# Patient Record
Sex: Male | Born: 1970 | Hispanic: Yes | Marital: Married | Smoking: Former smoker
Health system: Southern US, Community
[De-identification: ages and names within clinical notes are randomized; demographics above are authoritative.]

## PROBLEM LIST (undated history)

## (undated) DIAGNOSIS — T7840XA Allergy, unspecified, initial encounter: Secondary | ICD-10-CM

## (undated) HISTORY — DX: Allergy, unspecified, initial encounter: T78.40XA

---

## 2019-06-20 DIAGNOSIS — Z20828 Contact with and (suspected) exposure to other viral communicable diseases: Secondary | ICD-10-CM | POA: Diagnosis not present

## 2020-05-10 ENCOUNTER — Other Ambulatory Visit: Payer: Self-pay

## 2020-05-11 ENCOUNTER — Ambulatory Visit (INDEPENDENT_AMBULATORY_CARE_PROVIDER_SITE_OTHER): Payer: BC Managed Care – PPO

## 2020-05-11 ENCOUNTER — Ambulatory Visit: Payer: BC Managed Care – PPO | Admitting: Family Medicine

## 2020-05-11 ENCOUNTER — Encounter: Payer: Self-pay | Admitting: Family Medicine

## 2020-05-11 VITALS — BP 120/84 | HR 77 | Temp 97.6°F | Ht 68.0 in | Wt 232.4 lb

## 2020-05-11 DIAGNOSIS — Z114 Encounter for screening for human immunodeficiency virus [HIV]: Secondary | ICD-10-CM

## 2020-05-11 DIAGNOSIS — R0609 Other forms of dyspnea: Secondary | ICD-10-CM

## 2020-05-11 DIAGNOSIS — Z1159 Encounter for screening for other viral diseases: Secondary | ICD-10-CM | POA: Diagnosis not present

## 2020-05-11 DIAGNOSIS — Z125 Encounter for screening for malignant neoplasm of prostate: Secondary | ICD-10-CM

## 2020-05-11 DIAGNOSIS — R0600 Dyspnea, unspecified: Secondary | ICD-10-CM | POA: Diagnosis not present

## 2020-05-11 DIAGNOSIS — Z13220 Encounter for screening for lipoid disorders: Secondary | ICD-10-CM

## 2020-05-11 DIAGNOSIS — Z1211 Encounter for screening for malignant neoplasm of colon: Secondary | ICD-10-CM

## 2020-05-11 DIAGNOSIS — Z131 Encounter for screening for diabetes mellitus: Secondary | ICD-10-CM

## 2020-05-11 LAB — LIPID PANEL
Cholesterol: 190 mg/dL (ref 0–200)
HDL: 40.4 mg/dL (ref >39.00)
LDL Cholesterol: 123 mg/dL — ABNORMAL HIGH (ref 0–99)
NonHDL: 149.12
Total CHOL/HDL Ratio: 5
Triglycerides: 132 mg/dL (ref 0.0–149.0)
VLDL: 26.4 mg/dL (ref 0.0–40.0)

## 2020-05-11 LAB — PSA: PSA: 0.24 ng/mL (ref 0.10–4.00)

## 2020-05-11 LAB — HEMOGLOBIN A1C: Hgb A1c MFr Bld: 5.9 % (ref 4.6–6.5)

## 2020-05-11 NOTE — Progress Notes (Signed)
[facility] [address] [place] [address] Dept: [phone] Dept Fax: [phone]  New Patient Office Visit  Subjective:    Patient ID: Joel Robinson, male    DOB: 08-Jul-1970, 50 y.o..   MRN: [id number]  Chief Complaint  Patient presents with  . Establish Care    NP- CPE/labs.  No concerns.  Declines both flu and covid.   Had covid in 2020 still having SOB when climbs stairs or running.     History of Present Illness:  Patient is in today to establish care. Mr. Joel [place] is from [place]. He has lived in the [place] area for the past 30 years. 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 He is married and has two children (20, 39 y.o.) He was a light smoker in the past, but quit 4 years ago.   Mr. Joel [place] notes that he had COVID-19 infection in 2020. Since that time, he has noted shortness of breath after climbing 2-3 flights of stairs. Prior to his infection, he notes he could climb 10 flights with no problems. He does note some coughing associated with the DOE. He recovers with 2-3 minutes of rest. He has had no fever. He has no history of swelling.  Mr. Joel Robinson had a past injury to his lower back, with a laceration from a piece of steel that fell and bounced towards him. He notes he had 14 stitches with this.  Mr. Joel Robinson notes occasional bilateral knee pain which he associates with the boots he wears.  Past Medical History: There are no problems to display for this patient.  History reviewed. No pertinent surgical history.  No family history on file.  No outpatient medications prior to visit.   No facility-administered medications prior to visit.   No Known Allergies    Objective:   Today's Vitals   05/11/20 0917  BP: 120/84  Pulse: 77  Temp: 97.6 F (36.4 C)  TempSrc: Temporal  SpO2: 98%  Weight: 232 lb 6.4 oz (105.4 kg)  Height: 5'  8" (1.727 m)   Body mass index is 35.34 kg/m.   General: Well developed, well nourished. No acute distress. HEENT: Normocephalic, non-traumatic. PERRL, EOMI. Conjunctiva clear. Fundiscopic exam shows normal disc and vasculature.    External ears normal. EAC and TMs normal bilaterally. Nose clear without congestion or rhinorrhea. Mucous membranes moist.   Oropharynx clear. Teeth in good repair. Neck: Supple. No lymphadenopathy. No thyromegaly. Lungs: Bibasilar fine rales present. No wheezing or ronchi. CV: RRR without murmurs or rubs. Pulses 2+ bilaterally. Abdomen: Soft, non-tender. Bowel sounds positive, normal pitch and frequency. No hepatosplenomegaly. No rebound or guarding. Back: Straight. Well helaed scar across the upper lumbar region horizontally. Extremities: Full ROM. No joint swelling or tenderness. No edema noted. Skin: Warm and dry. No rashes. Neuro:No focal neurological deficits. Psych: Alert and oriented. Normal mood and affect.  Health Maintenance Due  Topic Date Due  . Hepatitis C Screening  Never done  . TETANUS/TDAP  Never done  . COLONOSCOPY (Pts 45-73yrs Insurance coverage will need to be confirmed)  Never done  . INFLUENZA VACCINE  Never done   Imaging:  Chest x-ray: Diffuse interstitial  markings with some questionable nodular changes in the right hilum.  Assessment & Plan:   1. Dyspnea on exertion The CXR shows some possible interstitial lung disease. 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 I will refer him to pulmonology for assessment.  - DG Chest 2 View - Ambulatory referral to Pulmonology  2. Screening for diabetes mellitus (DM)  - Hemoglobin A1c  3. Screening for lipid disorders  - Lipid panel  4. Screening for prostate cancer  - PSA  5. Encounter for hepatitis C screening test for low risk patient  - HCV Ab w Reflex to Quant PCR  6. Screening for HIV (human immunodeficiency virus)  -  HIV Antibody (routine testing w rflx)  7. Screening for colon cancer  - Ambulatory referral to Gastroenterology  Haydee Salter, MD

## 2020-05-12 LAB — HCV INTERPRETATION

## 2020-05-12 LAB — HCV AB W REFLEX TO QUANT PCR: HCV Ab: <0.1 s/co ratio (ref 0.0–0.9)

## 2020-05-13 LAB — HIV ANTIBODY (ROUTINE TESTING W REFLEX): HIV 1&2 Ab, 4th Generation: NONREACTIVE

## 2020-05-14 ENCOUNTER — Encounter: Payer: Self-pay | Admitting: Family Medicine

## 2020-05-14 DIAGNOSIS — E785 Hyperlipidemia, unspecified: Secondary | ICD-10-CM | POA: Insufficient documentation

## 2020-05-14 DIAGNOSIS — R7303 Prediabetes: Secondary | ICD-10-CM | POA: Insufficient documentation

## 2020-05-14 DIAGNOSIS — J8410 Pulmonary fibrosis, unspecified: Secondary | ICD-10-CM | POA: Insufficient documentation

## 2020-08-20 ENCOUNTER — Encounter: Payer: Self-pay | Admitting: Gastroenterology

## 2020-08-22 ENCOUNTER — Encounter: Payer: Self-pay | Admitting: Pulmonary Disease

## 2020-08-22 ENCOUNTER — Other Ambulatory Visit: Payer: Self-pay

## 2020-08-22 ENCOUNTER — Ambulatory Visit: Payer: BC Managed Care – PPO | Admitting: Pulmonary Disease

## 2020-08-22 VITALS — BP 110/70 | HR 75 | Temp 98.1°F | Ht 66.0 in | Wt 227.4 lb

## 2020-08-22 DIAGNOSIS — Z8616 Personal history of COVID-19: Secondary | ICD-10-CM | POA: Diagnosis not present

## 2020-08-22 DIAGNOSIS — R0602 Shortness of breath: Secondary | ICD-10-CM | POA: Diagnosis not present

## 2020-08-22 DIAGNOSIS — J849 Interstitial pulmonary disease, unspecified: Secondary | ICD-10-CM

## 2020-08-22 MED ORDER — BUDESONIDE-FORMOTEROL FUMARATE 160-4.5 MCG/ACT IN AERO: 2.0000 | INHALATION_SPRAY | Freq: Two times a day (BID) | RESPIRATORY_TRACT | 6 refills | Status: DC

## 2020-08-22 NOTE — Progress Notes (Signed)
Synopsis: Referred in June 2022 for Dyspnea on exertion by Joel Marker, MD  Subjective:   PATIENT ID: Joel Robinson GENDER: male DOB: 1970-05-14, MRN: [id number]  HPI  Chief Complaint  Patient presents with   Consult    Patient reports that he has shortness of breath on exertion with walking up stairs mainly. He reports that he can take a few steps and has to stop to catch his breath.    Joel Robinson is a 50 year male, former smoker who is referred to pulmonary clinic for dyspnea on exertion.   He has noticed issues with shortness of breath and cough with exertion since he had covid 19 in 2020. He reports walking up a flight or two of steps and starts to cough.   He is from [place]. He has lived in the [place] area for the past 30 years. 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  He smoked for a 4-5 year period of time, about 3 cigarettes per week. He has not smoked in 5 years.   No family history of lung disease.  Past Medical History:  Diagnosis Date   Allergy      No family history on file.   Social History   Socioeconomic History   Marital status: Married    Spouse name: Not on file   Number of children: Not on file   Years of education: Not on file   Highest education level: Not on file  Occupational History   Not on file  Tobacco Use   Smoking status: Former    Pack years: 0.00   Smokeless tobacco: Never  Vaping Use   Vaping Use: Never used  Substance and Sexual Activity   Alcohol use: Not Currently   Drug use: Never   Sexual activity: Yes  Other Topics Concern   Not on file  Social History Narrative   Not on file   Social Determinants of Health   Financial Resource Strain: Not on file  Food Insecurity: Not on file  Transportation Needs: Not on file  Physical Activity:  Not on file  Stress: Not on file  Social Connections: Not on file  Intimate Partner Violence: Not on file     No Known Allergies   No outpatient medications prior to visit.   No facility-administered medications prior to visit.    Review of Systems  Constitutional:  Negative for chills, fever, malaise/fatigue and weight loss.  HENT:  Negative for congestion, sinus pain and sore throat.   Eyes: Negative.   Respiratory:  Positive for cough, shortness of breath and wheezing (rare). Negative for hemoptysis and sputum production.   Cardiovascular:  Negative for chest pain, palpitations, orthopnea, claudication and leg swelling.  Gastrointestinal:  Negative for abdominal pain, heartburn, nausea and vomiting.  Genitourinary: Negative.   Musculoskeletal:  Negative for joint pain and myalgias.  Skin:  Negative for rash.  Neurological:  Negative for weakness.  Endo/Heme/Allergies: Negative.   Psychiatric/Behavioral: Negative.     Objective:   Vitals:   08/22/20 1030  BP: 110/70  Pulse: 75  Temp: 98.1 F (36.7 C)  TempSrc: Oral  SpO2: 95%  Weight: 227 lb 6.4 oz (103.1 kg)  Height: 5\' 6"  (1.676 m)   Physical Exam Constitutional:      General: He  is not in acute distress.    Appearance: Normal appearance.  HENT:     Head: Normocephalic and atraumatic.     Nose: Nose normal.     Mouth/Throat:     Mouth: Mucous membranes are moist.  Eyes:     Extraocular Movements: Extraocular movements intact.     Conjunctiva/sclera: Conjunctivae normal.     Pupils: Pupils are equal, round, and reactive to light.  Cardiovascular:     Rate and Rhythm: Normal rate and regular rhythm.     Pulses: Normal pulses.     Heart sounds: Normal heart sounds. No murmur heard. Pulmonary:     Effort: Pulmonary effort is normal.     Breath sounds: Rales (dry, left base) present. No wheezing or rhonchi.  Abdominal:     General: Bowel sounds are normal.     Palpations: Abdomen is soft.   Musculoskeletal:     Right lower leg: No edema.     Left lower leg: No edema.  Lymphadenopathy:     Cervical: No cervical adenopathy.  Skin:    General: Skin is warm and dry.  Neurological:     General: No focal deficit present.     Mental Status: He is alert.  Psychiatric:        Mood and Affect: Mood normal.        Behavior: Behavior normal.        Thought Content: Thought content normal.        Judgment: Judgment normal.   CBC No results found for: WBC, RBC, HGB, HCT, PLT, MCV, MCH, MCHC, RDW, LYMPHSABS, MONOABS, EOSABS, BASOSABS  Chest imaging: CXR 05/11/2020 Lung volumes are low. There is diffuse interstitial coarsening. Ill-defined opacity in the right mid lung and left lung base. Upper normal heart size. Normal mediastinal contours. No pleural fluid or pneumothorax. No acute osseous abnormalities are seen.  PFT: No flowsheet data found.    Assessment & Plan:   Interstitial pulmonary disease ([place]) - Plan: CT Chest High Resolution, Pulmonary function test, budesonide-formoterol (SYMBICORT) 160-4.5 MCG/ACT inhaler  History of COVID-19  Shortness of breath  Discussion: Joel [place] is a 50 year male, former smoker who is referred to pulmonary clinic for dyspnea on exertion.   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  His recent COVID infection in 2020 likely compounded injury to his lungs from his prior occupational risks.  We will further evaluate his symptoms with a high-resolution CT chest scan and formal pulmonary function testing.  We will trial him on Symbicort 160-4.5 MCG 2 puffs twice daily and monitor for improvement in his symptoms of cough and shortness of breath.  On simple walk-in clinic today his SPO2 minimum was 95%.  He does not require supplemental oxygen with ambulation or exertion.  I have encouraged him to wear the proper protective gear such as N95  masks or respirators while working around dust on the job site.  Follow-up in 2 months.  Joel Jackson, MD [facility] Office: [phone]    Current Outpatient Medications:    budesonide-formoterol (SYMBICORT) 160-4.5 MCG/ACT inhaler, Inhale 2 puffs into the lungs 2 (two) times daily., Disp: 1 each, Rfl: 6

## 2020-08-22 NOTE — Patient Instructions (Signed)
I am concerned you have interstitial lung disease from your work exposures and also your covid 19 infection. We will obtain the below studies to further evaluate this.  We will schedule you for a CT chest scan and pulmonary function tests to be done at your earliest convenience.   Start Symbicort inhaler 2 puffs twice daily - rinse mouth out after each use

## 2020-08-31 ENCOUNTER — Ambulatory Visit (INDEPENDENT_AMBULATORY_CARE_PROVIDER_SITE_OTHER)
Admission: RE | Admit: 2020-08-31 | Discharge: 2020-08-31 | Disposition: A | Discharge status: Home / Self Care | Payer: BC Managed Care – PPO | Source: Ambulatory Visit | Attending: Pulmonary Disease | Admitting: Pulmonary Disease

## 2020-08-31 ENCOUNTER — Other Ambulatory Visit: Payer: Self-pay

## 2020-08-31 DIAGNOSIS — J189 Pneumonia, unspecified organism: Secondary | ICD-10-CM | POA: Diagnosis not present

## 2020-08-31 DIAGNOSIS — J849 Interstitial pulmonary disease, unspecified: Secondary | ICD-10-CM | POA: Diagnosis not present

## 2020-08-31 DIAGNOSIS — R918 Other nonspecific abnormal finding of lung field: Secondary | ICD-10-CM | POA: Diagnosis not present

## 2020-08-31 DIAGNOSIS — R911 Solitary pulmonary nodule: Secondary | ICD-10-CM | POA: Diagnosis not present

## 2020-08-31 DIAGNOSIS — I700 Atherosclerosis of aorta: Secondary | ICD-10-CM | POA: Diagnosis not present

## 2020-10-02 ENCOUNTER — Ambulatory Visit: Payer: Self-pay | Admitting: Internal Medicine

## 2020-10-16 ENCOUNTER — Ambulatory Visit (AMBULATORY_SURGERY_CENTER): Payer: Self-pay

## 2020-10-16 ENCOUNTER — Other Ambulatory Visit: Payer: Self-pay

## 2020-10-16 VITALS — Ht 66.0 in | Wt 234.0 lb

## 2020-10-16 DIAGNOSIS — Z1211 Encounter for screening for malignant neoplasm of colon: Secondary | ICD-10-CM

## 2020-10-16 NOTE — Progress Notes (Signed)
Patient is here in-person for PV. Patient denies any allergies to eggs or soy. Patient denies any problems with anesthesia/sedation. Patient denies any oxygen use at home. Patient denies taking any diet/weight loss medications or blood thinners. Patient is aware of our care-partner policy and 0000000 safety protocol.   EMMI education assigned to the patient for the procedure, sent to [facility].

## 2020-10-19 ENCOUNTER — Other Ambulatory Visit: Payer: Self-pay

## 2020-10-19 ENCOUNTER — Ambulatory Visit: Payer: BC Managed Care – PPO | Admitting: Pulmonary Disease

## 2020-10-19 ENCOUNTER — Ambulatory Visit: Payer: BC Managed Care – PPO

## 2020-10-30 ENCOUNTER — Encounter: Payer: Self-pay | Admitting: Gastroenterology

## 2020-10-30 ENCOUNTER — Other Ambulatory Visit: Payer: Self-pay

## 2020-10-30 ENCOUNTER — Ambulatory Visit (AMBULATORY_SURGERY_CENTER): Payer: BC Managed Care – PPO | Admitting: Gastroenterology

## 2020-10-30 VITALS — BP 112/74 | HR 67 | Resp 20

## 2020-10-30 DIAGNOSIS — Z1211 Encounter for screening for malignant neoplasm of colon: Secondary | ICD-10-CM | POA: Diagnosis not present

## 2020-10-30 DIAGNOSIS — D123 Benign neoplasm of transverse colon: Secondary | ICD-10-CM

## 2020-10-30 MED ORDER — SODIUM CHLORIDE 0.9 % IV SOLN: 500.0000 mL | INTRAVENOUS | Status: DC

## 2020-10-30 NOTE — Patient Instructions (Signed)
HANDOUTS ON POLYPS AND HEMORRHOIDS GIVEN TO YOU TODAY  AWAIT PATHOLOGY RESULTS ON POLYPS REMOVED   CT SCAN WILL BE SCHEDULED BY DR MANSOURATY'S OFFICE AND THEY WILL CALL YOU W/ DATE & TIME FOR CT SCAN- 2 BOTTLES OF CONTRAST WERE GIVEN TO YOU TODAY WITH INSTUCTION SHEET FOR YOU TO FILL OUT WHEN OFFICE CALLS YOU WITH DETAILS . YOU MAY REFRIDGERATE CONTRAST IF NEEDED BUT DO NOT DILUTE    USE FIBER CON TABLETS 1-2 DAILY WITH WATER INTAKE AS DIRECTED   FOLLOW HIGH FIBER DIET -HANDOUT GIVEN TO YOU TODAY   YOU HAD AN ENDOSCOPIC PROCEDURE TODAY AT [facility]:   Refer to the procedure report that was given to you for any specific questions about what was found during the examination.  If the procedure report does not answer your questions, please call your gastroenterologist to clarify.  If you requested that your care partner not be given the details of your procedure findings, then the procedure report has been included in a sealed envelope for you to review at your convenience later.  YOU SHOULD EXPECT: Some feelings of bloating in the abdomen. Passage of more gas than usual.  Walking can help get rid of the air that was put into your GI tract during the procedure and reduce the bloating. If you had a lower endoscopy (such as a colonoscopy or flexible sigmoidoscopy) you may notice spotting of blood in your stool or on the toilet paper. If you underwent a bowel prep for your procedure, you may not have a normal bowel movement for a few days.  Please Note:  You might notice some irritation and congestion in your nose or some drainage.  This is from the oxygen used during your procedure.  There is no need for concern and it should clear up in a day or so.  SYMPTOMS TO REPORT IMMEDIATELY:  Following lower endoscopy (colonoscopy or flexible sigmoidoscopy):  Excessive amounts of blood in the stool  Significant tenderness or worsening of abdominal pains  Swelling of the abdomen that  is new, acute  Fever of 100F or higher    For urgent or emergent issues, a gastroenterologist can be reached at any hour by calling [phone]. Do not use MyChart messaging for urgent concerns.    DIET:  We do recommend a small meal at first, but then you may proceed to your regular diet.  Drink plenty of fluids but you should avoid alcoholic beverages for 24 hours.  ACTIVITY:  You should plan to take it easy for the rest of today and you should NOT DRIVE or use heavy machinery until tomorrow (because of the sedation medicines used during the test).    FOLLOW UP: Our staff will call the number listed on your records 48-72 hours following your procedure to check on you and address any questions or concerns that you may have regarding the information given to you following your procedure. If we do not reach you, we will leave a message.  We will attempt to reach you two times.  During this call, we will ask if you have developed any symptoms of COVID 19. If you develop any symptoms (ie: fever, flu-like symptoms, shortness of breath, cough etc.) before then, please call [phone].  If you test positive for Covid 19 in the 2 weeks post procedure, please call and report this information to [place].    If any biopsies were taken you will be contacted by phone or by letter  within the next 1-3 weeks.  Please call us at [phone] if you have not heard about the biopsies in 3 weeks.    SIGNATURES/CONFIDENTIALITY: You and/or your care partner have signed paperwork which will be entered into your electronic medical record.  These signatures attest to the fact that that the information above on your After Visit Summary has been reviewed and is understood.  Full responsibility of the confidentiality of this discharge information lies with you and/or your care-partner.

## 2020-10-30 NOTE — Progress Notes (Signed)
GASTROENTEROLOGY PROCEDURE H&P NOTE   Primary Care Physician: Haydee Salter, MD  HPI: [place] is a 50 y.o. male who presents for Colonoscopy for screening.  Past Medical History:  Diagnosis Date   Allergy    History reviewed. No pertinent surgical history. Current Outpatient Medications  Medication Sig Dispense Refill   budesonide-formoterol (SYMBICORT) 160-4.5 MCG/ACT inhaler Inhale 2 puffs into the lungs 2 (two) times daily. 1 each 6   acetaminophen (TYLENOL) 325 MG tablet Take 650 mg by mouth every 6 (six) hours as needed.     Current Facility-Administered Medications  Medication Dose Route Frequency Provider Last Rate Last Admin   0.9 %  sodium chloride infusion  500 mL Intravenous Continuous Mansouraty, Telford Nab., MD        Current Outpatient Medications:    budesonide-formoterol (SYMBICORT) 160-4.5 MCG/ACT inhaler, Inhale 2 puffs into the lungs 2 (two) times daily., Disp: 1 each, Rfl: 6   acetaminophen (TYLENOL) 325 MG tablet, Take 650 mg by mouth every 6 (six) hours as needed., Disp: , Rfl:   Current Facility-Administered Medications:    0.9 %  sodium chloride infusion, 500 mL, Intravenous, Continuous, Mansouraty, Telford Nab., MD No Known Allergies Family History  Problem Relation Age of Onset   Colon cancer Neg Hx    Colon polyps Neg Hx    Esophageal cancer Neg Hx    Rectal cancer Neg Hx    Stomach cancer Neg Hx    Social History   Socioeconomic History   Marital status: Married    Spouse name: Not on file   Number of children: Not on file   Years of education: Not on file   Highest education level: Not on file  Occupational History   Not on file  Tobacco Use   Smoking status: Former    Years: 5.00    Types: Cigarettes   Smokeless tobacco: Never  Vaping Use   Vaping Use: Never used  Substance and Sexual Activity   Alcohol use: Not Currently   Drug use: Never   Sexual activity: Yes  Other Topics Concern   Not on file  Social  History Narrative   Not on file   Social Determinants of Health   Financial Resource Strain: Not on file  Food Insecurity: Not on file  Transportation Needs: Not on file  Physical Activity: Not on file  Stress: Not on file  Social Connections: Not on file  Intimate Partner Violence: Not on file    Physical Exam: Vital signs in last 24 hours: '@VSRANGES'$ @   GEN: NAD EYE: Sclerae anicteric ENT: MMM CV: Non-tachycardic GI: Soft, NT/ND NEURO:  Alert & Oriented x 3  Lab Results: No results for input(s): WBC, HGB, HCT, PLT in the last 72 hours. BMET No results for input(s): NA, K, CL, CO2, GLUCOSE, BUN, CREATININE, CALCIUM in the last 72 hours. LFT No results for input(s): PROT, ALBUMIN, AST, ALT, ALKPHOS, BILITOT, BILIDIR, IBILI in the last 72 hours. PT/INR No results for input(s): LABPROT, INR in the last 72 hours.   Impression / Plan: This is a 50 y.o.male who presents for Colonoscopy for screening.  The risks and benefits of endoscopic evaluation/treatment were discussed with the patient and/or family; these include but are not limited to the risk of perforation, infection, bleeding, missed lesions, lack of diagnosis, severe illness requiring hospitalization, as well as anesthesia and sedation related illnesses.  The patient's history has been reviewed, patient examined, no change in status, and deemed stable  for procedure.  The patient and/or family is agreeable to proceed.    Justice Britain, MD [facility] Office # [phone]

## 2020-10-30 NOTE — Progress Notes (Signed)
Vs by cw.  Previsit completed

## 2020-10-30 NOTE — Op Note (Signed)
[facility] Patient Name: Joel Robinson Procedure Date: 10/30/2020 9:02 AM MRN: [id number] Endoscopist: Justice Britain , MD Age: 50 Referring MD:  Date of Birth: May 27, 1970 Gender: Male Account #: [id number] Procedure:                Colonoscopy Indications:              Screening for colorectal malignant neoplasm, This                            is the patient's first colonoscopy Medicines:                Monitored Anesthesia Care Procedure:                Pre-Anesthesia Assessment:                           - Prior to the procedure, a History and Physical                            was performed, and patient medications and                            allergies were reviewed. The patient's tolerance of                            previous anesthesia was also reviewed. The risks                            and benefits of the procedure and the sedation                            options and risks were discussed with the patient.                            All questions were answered, and informed consent                            was obtained. Prior Anticoagulants: The patient has                            taken no previous anticoagulant or antiplatelet                            agents. ASA Grade Assessment: II - A patient with                            mild systemic disease. After reviewing the risks                            and benefits, the patient was deemed in                            satisfactory condition to undergo the procedure.  After obtaining informed consent, the colonoscope                            was passed under direct vision. Throughout the                            procedure, the patient's blood pressure, pulse, and                            oxygen saturations were monitored continuously. The                            Olympus CF-HQ190L ([id number]) Colonoscope was                            introduced through the  anus and advanced to the 5                            cm into the ileum. The colonoscopy was performed                            without difficulty. The patient tolerated the                            procedure. The quality of the bowel preparation was                            adequate. The terminal ileum, ileocecal valve,                            appendiceal orifice, and rectum were photographed. Scope In: 9:11:08 AM Scope Out: 9:27:43 AM Scope Withdrawal Time: 0 hours 14 minutes 0 seconds  Total Procedure Duration: 0 hours 16 minutes 35 seconds  Findings:                 The digital rectal exam findings include                            hemorrhoids. Pertinent negatives include no                            palpable rectal lesions.                           The terminal ileum and ileocecal valve appeared                            normal.                           The appendiceal orifice looked to be inverted most                            likely vs the possibility of there being a  submucosal nodule. I could see what appeared to be                            mucous secrete out of the inversion as well.                           Two sessile polyps were found in the transverse                            colon. The polyps were 2 to 4 mm in size. These                            polyps were removed with a cold snare. Resection                            and retrieval were complete.                           Normal mucosa was found in the entire colon                            otherwise.                           Non-bleeding non-thrombosed external and internal                            hemorrhoids were found during retroflexion, during                            perianal exam and during digital exam. The                            hemorrhoids were Grade II (internal hemorrhoids                            that prolapse but reduce  spontaneously). Complications:            No immediate complications. Estimated Blood Loss:     Estimated blood loss was minimal. Impression:               - Hemorrhoids found on digital rectal exam.                           - The examined portion of the ileum was normal.                           - Inverted appendix orifice vs submucosal nodule at                            the appendiceal orifice.                           - Two 2 to 4 mm polyps in the transverse colon,  removed with a cold snare. Resected and retrieved.                           - Normal mucosa in the entire examined colon                            otherwise.                           - Non-bleeding non-thrombosed external and internal                            hemorrhoids. Recommendation:           - The patient will be observed post-procedure,                            until all discharge criteria are met.                           - Discharge patient to home.                           - Patient has a contact number available for                            emergencies. The signs and symptoms of potential                            delayed complications were discussed with the                            patient. Return to normal activities tomorrow.                            Written discharge instructions were provided to the                            patient.                           - High fiber diet.                           - Use FiberCon 1-2 tablets PO daily.                           - Continue present medications.                           - Recommend non-urgent CT-AP to evaluate the                            subepithelial bulge/inverted appendix to ensure no                            evidence of a mucinous appendix that  could require                            interventions/evaluation in future.                           - Await pathology results.                            - Repeat colonoscopy in 06/30/08 years for                            surveillance based on pathology results and                            findings of adenomatous tissue.                           - The findings and recommendations were discussed                            with the patient.                           - The findings and recommendations were discussed                            with the patient's family. Justice Britain, MD 10/30/2020 9:34:58 AM

## 2020-10-30 NOTE — Progress Notes (Signed)
PT taken to PACU. Monitors in place. VSS. Report given to RN. 

## 2020-11-01 ENCOUNTER — Telehealth: Payer: Self-pay | Admitting: *Deleted

## 2020-11-01 ENCOUNTER — Other Ambulatory Visit: Payer: Self-pay

## 2020-11-01 DIAGNOSIS — K389 Disease of appendix, unspecified: Secondary | ICD-10-CM

## 2020-11-01 NOTE — Telephone Encounter (Signed)
Follow up call made. 

## 2020-11-01 NOTE — Telephone Encounter (Signed)
  Follow up Call-  Call back number 10/30/2020  Post procedure Call Back phone  # [phone]  Permission to leave phone message Yes  Some recent data might be hidden     Patient questions:  Do you have a fever, pain , or abdominal swelling? No. Pain Score  0 *  Have you tolerated food without any problems? Yes.    Have you been able to return to your normal activities? Yes.    Do you have any questions about your discharge instructions: Diet   No. Medications  No. Follow up visit  No.  Do you have questions or concerns about your Care? No.  Actions: * If pain score is 4 or above: No action needed, pain <4.  Have you developed a fever since your procedure? no  2.   Have you had an respiratory symptoms (SOB or cough) since your procedure? no  3.   Have you tested positive for COVID 19 since your procedure no  4.   Have you had any family members/close contacts diagnosed with the COVID 19 since your procedure?  no   If yes to any of these questions please route to Joylene John, RN and Joella Prince, RN

## 2020-11-02 ENCOUNTER — Other Ambulatory Visit: Payer: Self-pay

## 2020-11-02 ENCOUNTER — Ambulatory Visit (INDEPENDENT_AMBULATORY_CARE_PROVIDER_SITE_OTHER): Payer: BC Managed Care – PPO | Admitting: Pulmonary Disease

## 2020-11-02 ENCOUNTER — Ambulatory Visit: Payer: BC Managed Care – PPO | Admitting: Primary Care

## 2020-11-02 ENCOUNTER — Encounter: Payer: Self-pay | Admitting: Gastroenterology

## 2020-11-02 ENCOUNTER — Encounter: Payer: Self-pay | Admitting: Primary Care

## 2020-11-02 VITALS — BP 130/90 | HR 85 | Temp 97.8°F | Ht 68.0 in | Wt 229.3 lb

## 2020-11-02 DIAGNOSIS — J849 Interstitial pulmonary disease, unspecified: Secondary | ICD-10-CM | POA: Diagnosis not present

## 2020-11-02 DIAGNOSIS — J8410 Pulmonary fibrosis, unspecified: Secondary | ICD-10-CM | POA: Diagnosis not present

## 2020-11-02 LAB — PULMONARY FUNCTION TEST
DL/VA % pred: 149 %
DL/VA: 6.65 ml/min/mmHg/L
DLCO cor % pred: 98 %
DLCO cor: 26.92 ml/min/mmHg
DLCO unc % pred: 98 %
DLCO unc: 26.92 ml/min/mmHg
FEF 25-75 Post: 2.15 L/sec
FEF 25-75 Pre: 3.77 L/sec
FEF2575-%Change-Post: -42 %
FEF2575-%Pred-Post: 66 %
FEF2575-%Pred-Pre: 116 %
FEV1-%Change-Post: -6 %
FEV1-%Pred-Post: 71 %
FEV1-%Pred-Pre: 76 %
FEV1-Post: 2.6 L
FEV1-Pre: 2.8 L
FEV1FVC-%Change-Post: -6 %
FEV1FVC-%Pred-Pre: 113 %
FEV6-%Change-Post: 0 %
FEV6-%Pred-Post: 69 %
FEV6-%Pred-Pre: 69 %
FEV6-Post: 3.15 L
FEV6-Pre: 3.13 L
FEV6FVC-%Change-Post: 0 %
FEV6FVC-%Pred-Post: 103 %
FEV6FVC-%Pred-Pre: 103 %
FVC-%Change-Post: 0 %
FVC-%Pred-Post: 66 %
FVC-%Pred-Pre: 67 %
FVC-Post: 3.15 L
FVC-Pre: 3.17 L
Post FEV1/FVC ratio: 83 %
Post FEV6/FVC ratio: 100 %
Pre FEV1/FVC ratio: 88 %
Pre FEV6/FVC Ratio: 100 %
RV % pred: 93 %
RV: 1.83 L
TLC % pred: 74 %
TLC: 4.91 L

## 2020-11-02 MED ORDER — BUDESONIDE-FORMOTEROL FUMARATE 160-4.5 MCG/ACT IN AERO: 2.0000 | INHALATION_SPRAY | Freq: Two times a day (BID) | RESPIRATORY_TRACT | 6 refills | Status: AC

## 2020-11-02 NOTE — Assessment & Plan Note (Addendum)
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 He has minimal respiratory symptoms. PFTs today showed mild restrictive defect with normal diffusion capacity. He did not desaturate on ambulatory walk test today, lowest O2 reading was 97% RA. We will check serology today and have patient fill out ILD packet. Plan is to repeat spirometry with DLCO 3 months. If ILD persists in 6-12 months or worsens will need intervention. Continue Symbicort 122mg two puffs twice daily for now. FU with Dr. DErin Fullingin 1 month to review testing and questionnaire.

## 2020-11-02 NOTE — Patient Instructions (Addendum)
HRCT showed early fibrosis of your lungs, we need to sort out of there is an underlying cause We will get some lab work today and have you complete ILD questionnaire  Please follow-up with PCP regarding atherosclerosis on imaging (plaque buildup in heart, non-urgent)  Orders: ILD labs today  Spirometry with DLCO in 3 months (ordered) Simple walk test today in office   Follow-up: October with Dr. Erin Fulling  (please bring questionnaire with you to next visit)    Pulmonary Fibrosis Pulmonary fibrosis is a type of lung disease that causes scarring. Over time, the scar tissue builds up in the air sacs of your lungs (alveoli). This makes it hard for you to breathe. Less oxygen can get into your blood. Scarring from pulmonary fibrosis gets worse over time. This damage is permanent and may lead to other serious health problems. What are the causes? There are many different causes of pulmonary fibrosis. Sometimes the cause is not known. This is called idiopathic pulmonary fibrosis. Other causes include: Exposure to chemicals and substances found in agricultural, farm, Architect, or factory work. These include mold, asbestos, silica, metal dusts, and toxic fumes. Sarcoidosis. In this disease, areas of inflammatory cells (granulomas) form and most often affect the lungs. Autoimmune diseases. These include diseases such as rheumatoid arthritis, systemic sclerosis, or connective tissue disease. Taking certain medicines. These include drugs used in radiation therapy or used to treat seizures, heart problems, and some infections. What increases the risk? You are more likely to develop this condition if: You have a family history of the disease. You are older. The condition is more common in older adults. You have a history of smoking. You have a job that exposes you to certain chemicals. You have gastroesophageal reflux disease (GERD). What are the signs or symptoms? Symptoms of this condition  include: Difficulty breathing that gets worse with activity. Shortness of breath (dyspnea). Dry, hacking cough. Rapid, shallow breathing during exercise or while at rest. Bluish skin and lips. Loss of appetite. Weakness. Weight loss and fatigue. Rounded and enlarged fingertips (clubbing). How is this diagnosed? This condition may be diagnosed based on: Your symptoms and medical history. A physical exam. You may also have tests, including: A test that involves looking inside your lungs with an instrument (bronchoscopy). Imaging studies of your lungs and heart. Tests to measure how well you are breathing (pulmonary function tests). Blood tests. Tests to see how well your lungs work while you are walking (pulmonary stress test). A procedure to remove a lung tissue sample to look at it under a microscope (biopsy). How is this treated? There is no cure for pulmonary fibrosis. Treatment focuses on managing symptoms and preventing scarring from getting worse. This may include: Medicines, such as: Steroids to prevent permanent lung changes. Medicines to suppress your body's defense system (immune system). Medicines to help with lung function by reducing inflammation or scarring. Ongoing monitoring with X-rays and lab work. Oxygen therapy. Pulmonary rehabilitation. Surgery. In some cases, a lung transplant is possible. Follow these instructions at home:   Medicines Take over-the-counter and prescription medicines only as told by your health care provider. Keep your vaccinations up to date as recommended by your health care provider. General instructions Do not use any products that contain nicotine or tobacco, such as cigarettes and e-cigarettes. If you need help quitting, ask your health care provider. Get regular exercise, but do not overexert yourself. Ask your health care provider to suggest some activities that are safe for you to do.  If you have physical limitations, you may get  exercise by walking, using a stationary bike, or doing chair exercises. Ask your health care provider about using oxygen while exercising. If you are exposed to chemicals and substances at work, make sure that you wear a mask or respirator at all times. Join a pulmonary rehabilitation program or a support group for people with pulmonary fibrosis. Eat small meals often so you do not get too full. Overeating can make breathing trouble worse. Maintain a healthy weight. Lose weight if you need to. Do breathing exercises as directed by your health care provider. Keep all follow-up visits as told by your health care provider. This is important. Contact a health care provider if you: Have symptoms that do not get better with medicines. Are not able to be as active as usual. Have trouble taking a deep breath. Have a fever or chills. Have blue lips or skin. Have clubbing of your fingers. Get help right away if you: Have a sudden worsening of your symptoms. Have chest pain. Cough up mucus that is dark in color. Have a lot of headaches. Get very confused or sleepy. Summary Pulmonary fibrosis is a type of lung disease that causes scar tissue to build up in the air sacs of your lungs (alveoli) over time. Less oxygen can get into your blood. This makes it hard for you to breathe. Scarring from pulmonary fibrosis gets worse over time. This damage is permanent and may lead to other serious health problems. You are more likely to develop this condition if you have a family history of the condition or a job that exposes you to certain chemicals. There is no cure for pulmonary fibrosis. Treatment focuses on managing symptoms and preventing scarring from getting worse. This information is not intended to replace advice given to you by your health care provider. Make sure you discuss any questions you have with your health care provider. Document Revised: 03/18/2017 Document Reviewed: 03/18/2017 Elsevier  Patient Education  2022 [employer].

## 2020-11-02 NOTE — Progress Notes (Signed)
$'[name]$  ID: Joel Robinson, male    DOB: 1970-08-14, 50 y.o.   MRN: [id number]  Chief Complaint  Patient presents with   Follow-up    Pt states improvement since symbicort    Referring provider: Haydee Salter, MD  HPI: 50 year old male, former smoker.  Past medical history significant for postinflammatory pulmonary fibrosis, COVID-19, prediabetes, borderline hyperlipidemia.  Patient of Dr. Erin Fulling, seen for initial consult on 08/22/2020.  Previous [facility] pulmonary encounter: 08/22/20 - Dr. Erin Fulling, consult  Joel Robinson is a 50 year male, former smoker who is referred to pulmonary clinic for dyspnea on exertion.   He has noticed issues with shortness of breath and cough with exertion since he had covid 19 in 2020. He reports walking up a flight or two of steps and starts to cough.   He is from [place]. He has lived in the [place] area for the past 30 years. 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  He smoked for a 4-5 year period of time, about 3 cigarettes per week. He has not smoked in 5 years.   No family history of lung disease.  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  His recent COVID infection in 2020 likely compounded injury to his lungs from his prior occupational risks.  We will further evaluate his symptoms with a high-resolution CT chest scan and formal pulmonary function testing.  We will trial him on Symbicort 160-4.5 MCG 2 puffs twice daily and monitor for improvement in his symptoms of cough and shortness of breath.   On simple walk-in clinic today his SPO2 minimum was 95%.  He does not require supplemental oxygen with ambulation or exertion.  I have encouraged him to wear the proper protective gear  such as N95 masks or respirators while working around dust on the job site.    11/02/2020- Interim hx  Patient presents today for 2 month follow-up with PFTs. He is doing well today. She has no acute complaints. He has minimal respiratory symptoms. He does well walking on flat surfaces at his own pace. He experiences dyspnea and cough when walking up 2-3 flights of stairs. We reviewed CT imaging today that showed appearance lungs compatible with ILD, findings considered indeterminate for UIP. 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 Multiple small pulmonary nodules scattered through the lungs bilaterally measuring 79m or less.  Aortic atherosclerosis. Needs follow-up in 3-6 months to re-evaluate.   Pulmonary testing: 11/02/2020- FVC 3.15 (66%), FEV1 2.60 (71%), ratio 83, DLCOunc 26.92 (98%) Mild-moderate restriction without BD response. Normal diffusion capacity.   11/02/2020 Walked on RA x 3 laps = approx 750 ft at fast paced without oxygen desaturations. Lowest O2 97% RA   No Known Allergies   There is no immunization history on file for this patient.  Past Medical History:  Diagnosis Date   Allergy     Tobacco History: Social History   Tobacco Use  Smoking Status Former   Years: 5.00   Types: Cigarettes  Smokeless Tobacco Never   Counseling given: Not Answered   Outpatient Medications Prior to Visit  Medication Sig Dispense Refill   acetaminophen (TYLENOL) 325 MG tablet Take 650 mg by mouth every 6 (six) hours as needed.  budesonide-formoterol (SYMBICORT) 160-4.5 MCG/ACT inhaler Inhale 2 puffs into the lungs 2 (two) times daily. 1 each 6   No facility-administered medications prior to visit.    Review of Systems  Review of Systems  Constitutional: Negative.   HENT: Negative.    Respiratory:  Positive for cough and shortness of breath. Negative for chest tightness and wheezing.   Cardiovascular: Negative.     Physical Exam  BP 130/90 (BP Location: Left Arm, Patient Position:  Sitting, Cuff Size: Normal)   Pulse 85   Temp 97.8 F (36.6 C) (Oral)   Ht '5\' 8"'$  (1.727 m)   Wt 229 lb 4.8 oz (104 kg)   SpO2 98%   BMI 34.86 kg/m  Physical Exam Constitutional:      Appearance: Normal appearance.  HENT:     Head: Normocephalic and atraumatic.  Cardiovascular:     Rate and Rhythm: Normal rate and regular rhythm.  Pulmonary:     Breath sounds: Rales present. No wheezing or rhonchi.  Musculoskeletal:        General: Normal range of motion.  Neurological:     General: No focal deficit present.     Mental Status: He is alert and oriented to person, place, and time. Mental status is at baseline.  Psychiatric:        Mood and Affect: Mood normal.        Behavior: Behavior normal.        Thought Content: Thought content normal.        Judgment: Judgment normal.     Lab Results:  CBC No results found for: WBC, RBC, HGB, HCT, PLT, MCV, MCH, MCHC, RDW, LYMPHSABS, MONOABS, EOSABS, BASOSABS  BMET No results found for: NA, K, CL, CO2, GLUCOSE, BUN, CREATININE, CALCIUM, GFRNONAA, GFRAA  BNP No results found for: BNP  ProBNP No results found for: PROBNP  Imaging: No results found.   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 We will check serology today and have patient fill out ILD packet. Plan is to repeat spirometry with DLCO 3 months. If ILD persists in 6-12 months or worsens will need intervention. Continue Symbicort 121mg two puffs twice daily for now. FU with Dr. DErin Fullingin 1 month to review testing and questionnaire.    EMartyn Ehrich NP 11/02/2020

## 2020-11-02 NOTE — Progress Notes (Signed)
PFT done today. 

## 2020-11-05 ENCOUNTER — Encounter: Payer: Self-pay | Admitting: Family Medicine

## 2020-11-05 ENCOUNTER — Telehealth: Payer: Self-pay | Admitting: Primary Care

## 2020-11-05 DIAGNOSIS — Z86010 Personal history of colonic polyps: Secondary | ICD-10-CM | POA: Insufficient documentation

## 2020-11-05 LAB — ANTI-SCLERODERMA ANTIBODY: Scleroderma (Scl-70) (ENA) Antibody, IgG: <1 AI

## 2020-11-05 LAB — ANCA SCREEN W REFLEX TITER: ANCA Screen: NEGATIVE

## 2020-11-05 LAB — ANA,IFA RA DIAG PNL W/RFLX TIT/PATN
Anti Nuclear Antibody (ANA): NEGATIVE
Cyclic Citrullin Peptide Ab: <16 UNITS
Rheumatoid fact SerPl-aCnc: <14 IU/mL (ref <14)

## 2020-11-05 LAB — SJOGREN'S SYNDROME ANTIBODS(SSA + SSB)
SSA (Ro) (ENA) Antibody, IgG: <1 AI
SSB (La) (ENA) Antibody, IgG: <1 AI

## 2020-11-05 LAB — IGG, IGA, IGM
IgG (Immunoglobin G), Serum: 1430 mg/dL (ref 600–1640)
IgM, Serum: 113 mg/dL (ref 50–300)
Immunoglobulin A: 436 mg/dL — ABNORMAL HIGH (ref 47–310)

## 2020-11-05 NOTE — Telephone Encounter (Signed)
Patient needs an apt with Dr. Erin Fulling in October, he has openings was there a reason this was not made after he left visit by front staff?

## 2020-11-05 NOTE — Telephone Encounter (Signed)
Called and spoke with pt and have scheduled him an appt with Dr. Erin Fulling 12/04/20. Nothing further needed.

## 2020-11-05 NOTE — Progress Notes (Signed)
He is seeing you in October. IgA elevated

## 2020-11-05 NOTE — Progress Notes (Signed)
Interstitial Lung Disease Multidisciplinary Conference   Joel Robinson    MRN [id number]    DOB Mar 04, 1970  Primary Care Physician:Rudd, Lillette Boxer, MD  Referring Physician: Dr Ludwig Clarks  Time of Conference: 7.30am- 8.30am Date of conference: 10/02/20 tue Location of Conference: -  Virtual  Participating Pulmonary: Dr. Brand Males, MD - yes,  Dr Marshell Garfinkel, MD - yes Pathology: Dr Jaquita Folds, MD - no , Dr Enid Cutter - no Radiology: Dr Salvatore Marvel MD - no, Dr Vinnie Langton MD - yes,  Dr Lorin Picket, MD - no , Dr Eddie Candle - no Others: x  Brief History: 50 year male, former smoker who is referred to pulmonary clinic for dyspnea on exertion. 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 Had covid 19 in 2020. Had recent HRCT chest and awaiting PFTs from him  Serology: not available  MDD discussion of CT scan    - Date or time period of scan: July 2022: CT July 2022: sub pl  retic, ST. No sign areaa of br0onchietasis. No HC. No CCG. Mild to Moderate AT. RAD: INDETERIMINATE v ALTERNATE.  Could be NSIP.  REC:   - Features mentioned:   - What is the final conclusion per 2018 ATS/Fleischner Criteria - CT July 2022: sub pl  retic, ST. No sign areaa of br0onchietasis. No HC. No CCG. Mild to Moderate AT. RAD: INDETERIMINATE v ALTERNATE.  Could be NSIP.  REC:  BX esp with age 44  Pathology discussion of biopsy no path avail:  PFTs:  PFT Results Latest Ref Rng & Units 10/19/2020  FVC-Pre L 3.17  FVC-Predicted Pre % 67  FVC-Post L 3.15  FVC-Predicted Post % 66  Pre FEV1/FVC % % 88  Post FEV1/FCV % % 83  FEV1-Pre L 2.80  FEV1-Predicted Pre % 76  FEV1-Post L 2.60  DLCO uncorrected ml/min/mmHg 26.92  DLCO UNC% % 98  DLCO corrected ml/min/mmHg 26.92  DLCO COR %Predicted % 98  DLVA Predicted % 149  TLC L 4.91  TLC % Predicted % 74  RV % Predicted % 93      Labs: *no serology  MDD Impression/Recs: 1. Do ILD questionniare 2) Do seriology 3)  IF these are non diagnostic options are close monitoring (less ideal in younger fit folks beyond 3-12 months) for etiology neutral progressive phenotype or some kind of biopsy procedure - perhaps Envisia/BAL with progression to SLB based on shared decision making  Current diseaes status is very mild/early   Time Spent in preparation and discussion:  > 30 min    SIGNATURE   Dr. Brand Males, M.D., F.C.C.P,  Pulmonary and Critical Care Medicine Staff Physician, [place] Director - Interstitial Lung Disease  Program  [facility] [place], [place], [address]  Pager: [phone], If no answer or between  15:00h - 7:00h: call [phone] Telephone: [phone]  10:03 AM 11/05/2020 ...................................................................................................................Marland Kitchen References: Diagnosis of Hypersensitivity Pneumonitis in Adults. An Official ATS/JRS/ALAT Clinical Practice Guideline. Ragu G et al, [place] Aug 1;202(3):e36-e69.       Diagnosis of Idiopathic Pulmonary Fibrosis. An Official ATS/ERS/JRS/ALAT Clinical Practice Guideline. Raghu G et al, [place]. 2018 Sep 1;198(5):e44-e68.   IPF Suspected   Histopath ology Pattern      UIP  Probable  UIP  Indeterminate for  UIP  Alternative  diagnosis    UIP  IPF  IPF  IPF  Non-IPF dx   HRCT   Probabe UIP  IPF  IPF  IPF (Likely)**  Non-IPF dx  Pattern  Indeterminate for UIP  IPF  IPF (Likely)**  Indeterminate  for IPF**  Non-IPF dx    Alternative diagnosis  IPF (Likely)**/ non-IPF dx  Non-IPF dx  Non-IPF dx  Non-IPF dx     Idiopathic pulmonary fibrosis diagnosis based upon HRCT and Biopsy paterns.  ** IPF is the likely diagnosis when any of following features are  present:  Moderate-to-severe traction bronchiectasis/bronchiolectasis (defined as mild traction bronchiectasis/bronchiolectasis in four or more lobes including the lingual as a lobe, or moderate to severe traction bronchiectasis in two or more lobes) in a man over age 60 years or in a woman over age 38 years Extensive (>30%) reticulation on HRCT and an age >70 years  Increased neutrophils and/or absence of lymphocytosis in BAL fluid  Multidisciplinary discussion reaches a confident diagnosis of IPF.   **Indeterminate for IPF  Without an adequate biopsy is unlikely to be IPF  With an adequate biopsy may be reclassified to a more specific diagnosis after multidisciplinary discussion and/or additional consultation.   dx = diagnosis; HRCT = high-resolution computed tomography; IPF = idiopathic pulmonary fibrosis; UIP = usual interstitial pneumonia.

## 2020-11-08 ENCOUNTER — Other Ambulatory Visit: Payer: Self-pay

## 2020-11-08 ENCOUNTER — Ambulatory Visit (HOSPITAL_COMMUNITY)
Admission: RE | Admit: 2020-11-08 | Discharge: 2020-11-08 | Disposition: A | Discharge status: Home / Self Care | Payer: BC Managed Care – PPO | Source: Ambulatory Visit | Attending: Gastroenterology | Admitting: Gastroenterology

## 2020-11-08 DIAGNOSIS — K389 Disease of appendix, unspecified: Secondary | ICD-10-CM | POA: Insufficient documentation

## 2020-11-08 DIAGNOSIS — M4317 Spondylolisthesis, lumbosacral region: Secondary | ICD-10-CM | POA: Diagnosis not present

## 2020-11-08 DIAGNOSIS — M5137 Other intervertebral disc degeneration, lumbosacral region: Secondary | ICD-10-CM | POA: Diagnosis not present

## 2020-11-08 DIAGNOSIS — K7689 Other specified diseases of liver: Secondary | ICD-10-CM | POA: Diagnosis not present

## 2020-11-08 DIAGNOSIS — N281 Cyst of kidney, acquired: Secondary | ICD-10-CM | POA: Diagnosis not present

## 2020-11-08 MED ORDER — IOHEXOL 350 MG/ML SOLN
80.0000 mL | Freq: Once | INTRAVENOUS | Status: AC | PRN
  Administered 2020-11-08: 80 mL via INTRAVENOUS

## 2020-11-09 ENCOUNTER — Encounter: Payer: Self-pay | Admitting: Family Medicine

## 2020-11-09 DIAGNOSIS — R911 Solitary pulmonary nodule: Secondary | ICD-10-CM | POA: Insufficient documentation

## 2020-11-09 DIAGNOSIS — M5137 Other intervertebral disc degeneration, lumbosacral region: Secondary | ICD-10-CM | POA: Insufficient documentation

## 2020-11-09 LAB — HYPERSENSITIVITY PNEUMONITIS
A. Pullulans Abs: NEGATIVE
A.Fumigatus #1 Abs: NEGATIVE
Micropolyspora faeni, IgG: NEGATIVE
Pigeon Serum Abs: NEGATIVE
Thermoact. Saccharii: NEGATIVE
Thermoactinomyces vulgaris, IgG: NEGATIVE

## 2020-11-14 LAB — MYOMARKER 3 PLUS PROFILE (RDL)

## 2020-12-04 ENCOUNTER — Encounter: Payer: Self-pay | Admitting: Pulmonary Disease

## 2020-12-04 ENCOUNTER — Other Ambulatory Visit: Payer: Self-pay

## 2020-12-04 ENCOUNTER — Ambulatory Visit: Payer: BC Managed Care – PPO | Admitting: Pulmonary Disease

## 2020-12-04 VITALS — BP 116/72 | HR 77 | Ht 68.0 in | Wt 224.0 lb

## 2020-12-04 DIAGNOSIS — J849 Interstitial pulmonary disease, unspecified: Secondary | ICD-10-CM | POA: Diagnosis not present

## 2020-12-04 MED ORDER — ALBUTEROL SULFATE HFA 108 (90 BASE) MCG/ACT IN AERS: 2.0000 | INHALATION_SPRAY | Freq: Four times a day (QID) | RESPIRATORY_TRACT | 6 refills | Status: AC | PRN

## 2020-12-04 NOTE — Patient Instructions (Addendum)
Use symbicort inhaler 2 puffs twice daily - rinse mouth out after each use  Use albuterol inhaler as needed 1-2 puffs every 4-6 hours  We will schedule you for CT chest scan and pulmonary function tests in January 2023

## 2020-12-04 NOTE — Progress Notes (Signed)
Normal  Synopsis: Referred in June 2022 for Dyspnea on exertion by Arlester Marker, MD  Subjective:   PATIENT ID: Joel Robinson GENDER: male DOB: [birth year], MRN: [id number]  HPI  Chief Complaint  Patient presents with   Follow-up    4 mo f/u. States he has been doing well since last visit. Denies any new concerns.    Joel Robinson is a 50 year male, former smoker who returns to pulmonary clinic for dyspnea on exertion.   He has been feeling much better since last visit. He reports the symbicort inhaler has helped his exertional dyspnea.   He was seen by Joel Barrow, Joel Robinson on 11/02/20 where inflammatory workup was performed due to concern for interstitial lung disease based on his CT Chest scan. His inflammatory workup is unrevealing. We discussed at ILD multidisciplinary conference that performing bronchoscopy for BAL with cell count and differential and also for [facility] testing. PFTs 8/22 showed mild restrcitive defect.   OV 08/22/20 He has noticed issues with shortness of breath and cough with exertion since he had covid 19 in 2020. He reports walking up a flight or two of steps and starts to cough.   He is from [place]. He has lived in the [place] area for the past 30 years. 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  He smoked for a 4-5 year period of time, about 3 cigarettes per week. He has not smoked in 5 years.   No family history of lung disease.  Past Medical History:  Diagnosis Date   Allergy      Family History  Problem Relation Age of Onset   Colon cancer Neg Hx    Colon polyps Neg Hx    Esophageal cancer Neg Hx    Rectal cancer Neg Hx    Stomach cancer Neg Hx      Social History   Socioeconomic History   Marital status: Married    Spouse name: Not on file   Number of children: Not on file    Years of education: Not on file   Highest education level: Not on file  Occupational History   Not on file  Tobacco Use   Smoking status: Former    Years: 5.00    Types: Cigarettes   Smokeless tobacco: Never  Vaping Use   Vaping Use: Never used  Substance and Sexual Activity   Alcohol use: Not Currently   Drug use: Never   Sexual activity: Yes  Other Topics Concern   Not on file  Social History Narrative   Not on file   Social Determinants of Health   Financial Resource Strain: Not on file  Food Insecurity: Not on file  Transportation Needs: Not on file  Physical Activity: Not on file  Stress: Not on file  Social Connections: Not on file  Intimate Partner Violence: Not on file     No Known Allergies   Outpatient Medications Prior to Visit  Medication Sig Dispense Refill   acetaminophen (TYLENOL) 325 MG tablet Take 650 mg by mouth every 6 (six) hours as needed.     budesonide-formoterol (SYMBICORT) 160-4.5 MCG/ACT inhaler Inhale 2 puffs into the lungs 2 (two) times daily. 1 each 6   No facility-administered medications prior to visit.    Review of Systems  Constitutional:  Negative for chills, fever, malaise/fatigue and weight loss.  HENT:  Negative for congestion, sinus pain and sore throat.   Eyes: Negative.   Respiratory:  Positive for shortness of breath. Negative for cough, hemoptysis, sputum production and wheezing.   Cardiovascular:  Negative for chest pain, palpitations, orthopnea, claudication and leg swelling.  Gastrointestinal:  Negative for abdominal pain, heartburn, nausea and vomiting.  Genitourinary: Negative.   Musculoskeletal:  Negative for joint pain and myalgias.  Skin:  Negative for rash.  Neurological:  Negative for weakness.  Endo/Heme/Allergies: Negative.   Psychiatric/Behavioral: Negative.     Objective:   Vitals:   12/04/20 1601  BP: 116/72  Pulse: 77  SpO2: 98%  Weight: 224 lb (101.6 kg)  Height: 5\' 8"  (1.727 m)   Physical  Exam Constitutional:      General: He is not in acute distress.    Appearance: Normal appearance.  HENT:     Head: Normocephalic and atraumatic.     Nose: Nose normal.     Mouth/Throat:     Mouth: Mucous membranes are moist.  Eyes:     Extraocular Movements: Extraocular movements intact.     Conjunctiva/sclera: Conjunctivae normal.     Pupils: Pupils are equal, round, and reactive to light.  Cardiovascular:     Rate and Rhythm: Normal rate and regular rhythm.     Pulses: Normal pulses.     Heart sounds: Normal heart sounds. No murmur heard. Pulmonary:     Effort: Pulmonary effort is normal.     Breath sounds: Rales (mild, dry, basilar) present. No wheezing or rhonchi.  Abdominal:     General: Bowel sounds are normal.     Palpations: Abdomen is soft.  Musculoskeletal:     Right lower leg: No edema.     Left lower leg: No edema.  Lymphadenopathy:     Cervical: No cervical adenopathy.  Skin:    General: Skin is warm and dry.  Neurological:     General: No focal deficit present.     Mental Status: He is alert.  Psychiatric:        Mood and Affect: Mood normal.        Behavior: Behavior normal.        Thought Content: Thought content normal.        Judgment: Judgment normal.   CBC No results found for: WBC, RBC, HGB, HCT, PLT, MCV, MCH, MCHC, RDW, LYMPHSABS, MONOABS, EOSABS, BASOSABS  Chest imaging: HRCT Chest 08/31/20 Patchy groundglass attenuation and septal thickening scattered throughout the lungs bilaterally with no discernible craniocaudal gradient.  No traction bronchiectasis, banding or frank honeycombing noted.  Inspiratory and expiratory imaging demonstrates some mild to moderate air trapping.  Multiple small pulmonary nodules are noted throughout the lungs bilaterally measuring 8 mm in size or less.  CXR 05/11/2020 Lung volumes are low. There is diffuse interstitial coarsening. Ill-defined opacity in the right mid lung and left lung base. Upper normal heart size.  Normal mediastinal contours. No pleural fluid or pneumothorax. No acute osseous abnormalities are seen.  PFT: PFT Results Latest Ref Rng & Units 10/19/2020  FVC-Pre L 3.17  FVC-Predicted Pre % 67  FVC-Post L 3.15  FVC-Predicted Post % 66  Pre FEV1/FVC % % 88  Post FEV1/FCV % % 83  FEV1-Pre L 2.80  FEV1-Predicted Pre % 76  FEV1-Post L 2.60  DLCO uncorrected ml/min/mmHg 26.92  DLCO UNC% % 98  DLCO corrected ml/min/mmHg 26.92  DLCO COR %Predicted % 98  DLVA Predicted %  149  TLC L 4.91  TLC % Predicted % 74  RV % Predicted % 93  PFT: Mild restrictive defect    Assessment & Plan:   Interstitial pulmonary disease (HCC) - Plan: Pulmonary Function Test, CT Chest High Resolution, albuterol (VENTOLIN HFA) 108 (90 Base) MCG/ACT inhaler  Discussion: Joel Robinson is a 50 year male, former smoker who returns to pulmonary clinic for dyspnea on exertion.   His high-resolution CT scan is concerning for interstitial lung disease with an NSIP pattern.  His serologic inflammatory work-up is unrevealing.  He does not have any clinical signs of systemic inflammatory disease.  He has mild restrictive defect on pulmonary function testing.  He has clinically improved with Symbicort inhaler therapy.  He reports no issues with exertional dyspnea as he complained of before.  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  He also had COVID-19 infection in 2020.  I have recommended that he wear a respirator or N95 mask while on the job site around various dusts.  We will follow-up pulmonary function testing and high-resolution CT chest scan in 3 months.  He is to continue Symbicort inhaler therapy 2 puffs twice daily.  Follow-up in 2 months.  Freda Jackson, MD [facility] Office: [phone]    Current Outpatient Medications:    acetaminophen (TYLENOL) 325 MG tablet, Take 650 mg by mouth every 6 (six)  hours as needed., Disp: , Rfl:    albuterol (VENTOLIN HFA) 108 (90 Base) MCG/ACT inhaler, Inhale 2 puffs into the lungs every 6 (six) hours as needed for wheezing or shortness of breath., Disp: 8 g, Rfl: 6   budesonide-formoterol (SYMBICORT) 160-4.5 MCG/ACT inhaler, Inhale 2 puffs into the lungs 2 (two) times daily., Disp: 1 each, Rfl: 6

## 2020-12-08 ENCOUNTER — Encounter: Payer: Self-pay | Admitting: Pulmonary Disease

## 2020-12-21 ENCOUNTER — Ambulatory Visit: Payer: BC Managed Care – PPO | Admitting: Gastroenterology

## 2021-03-01 ENCOUNTER — Inpatient Hospital Stay: Admission: RE | Admit: 2021-03-01 | Payer: BC Managed Care – PPO | Source: Ambulatory Visit

## 2022-03-20 IMAGING — DX DG CHEST 2V
2 series · 2 of 2 positions shown · non-contrast
Comparison: None.

CLINICAL DATA: Dyspnea on exertion post COVID in 9595.

EXAM:
CHEST - 2 VIEW

[chest pa]
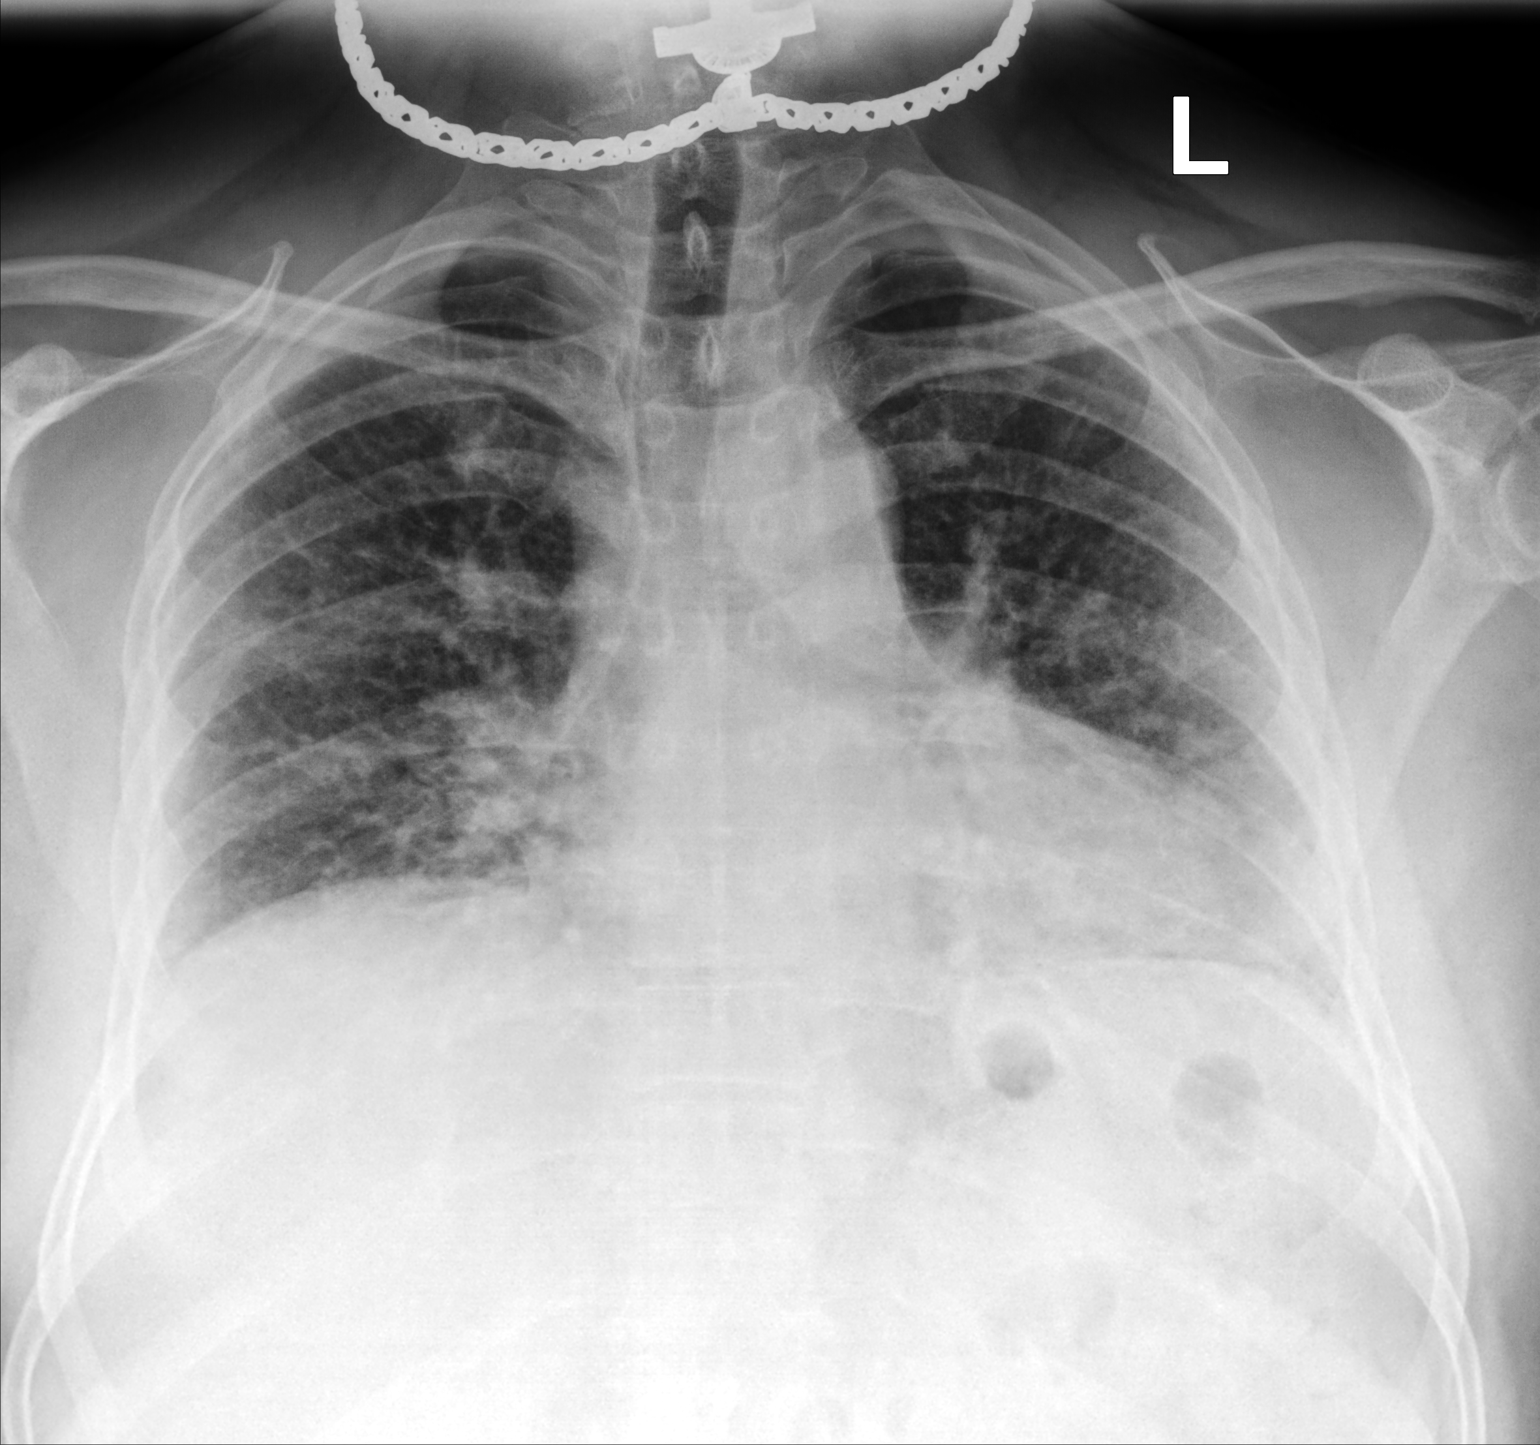

[chest lat]
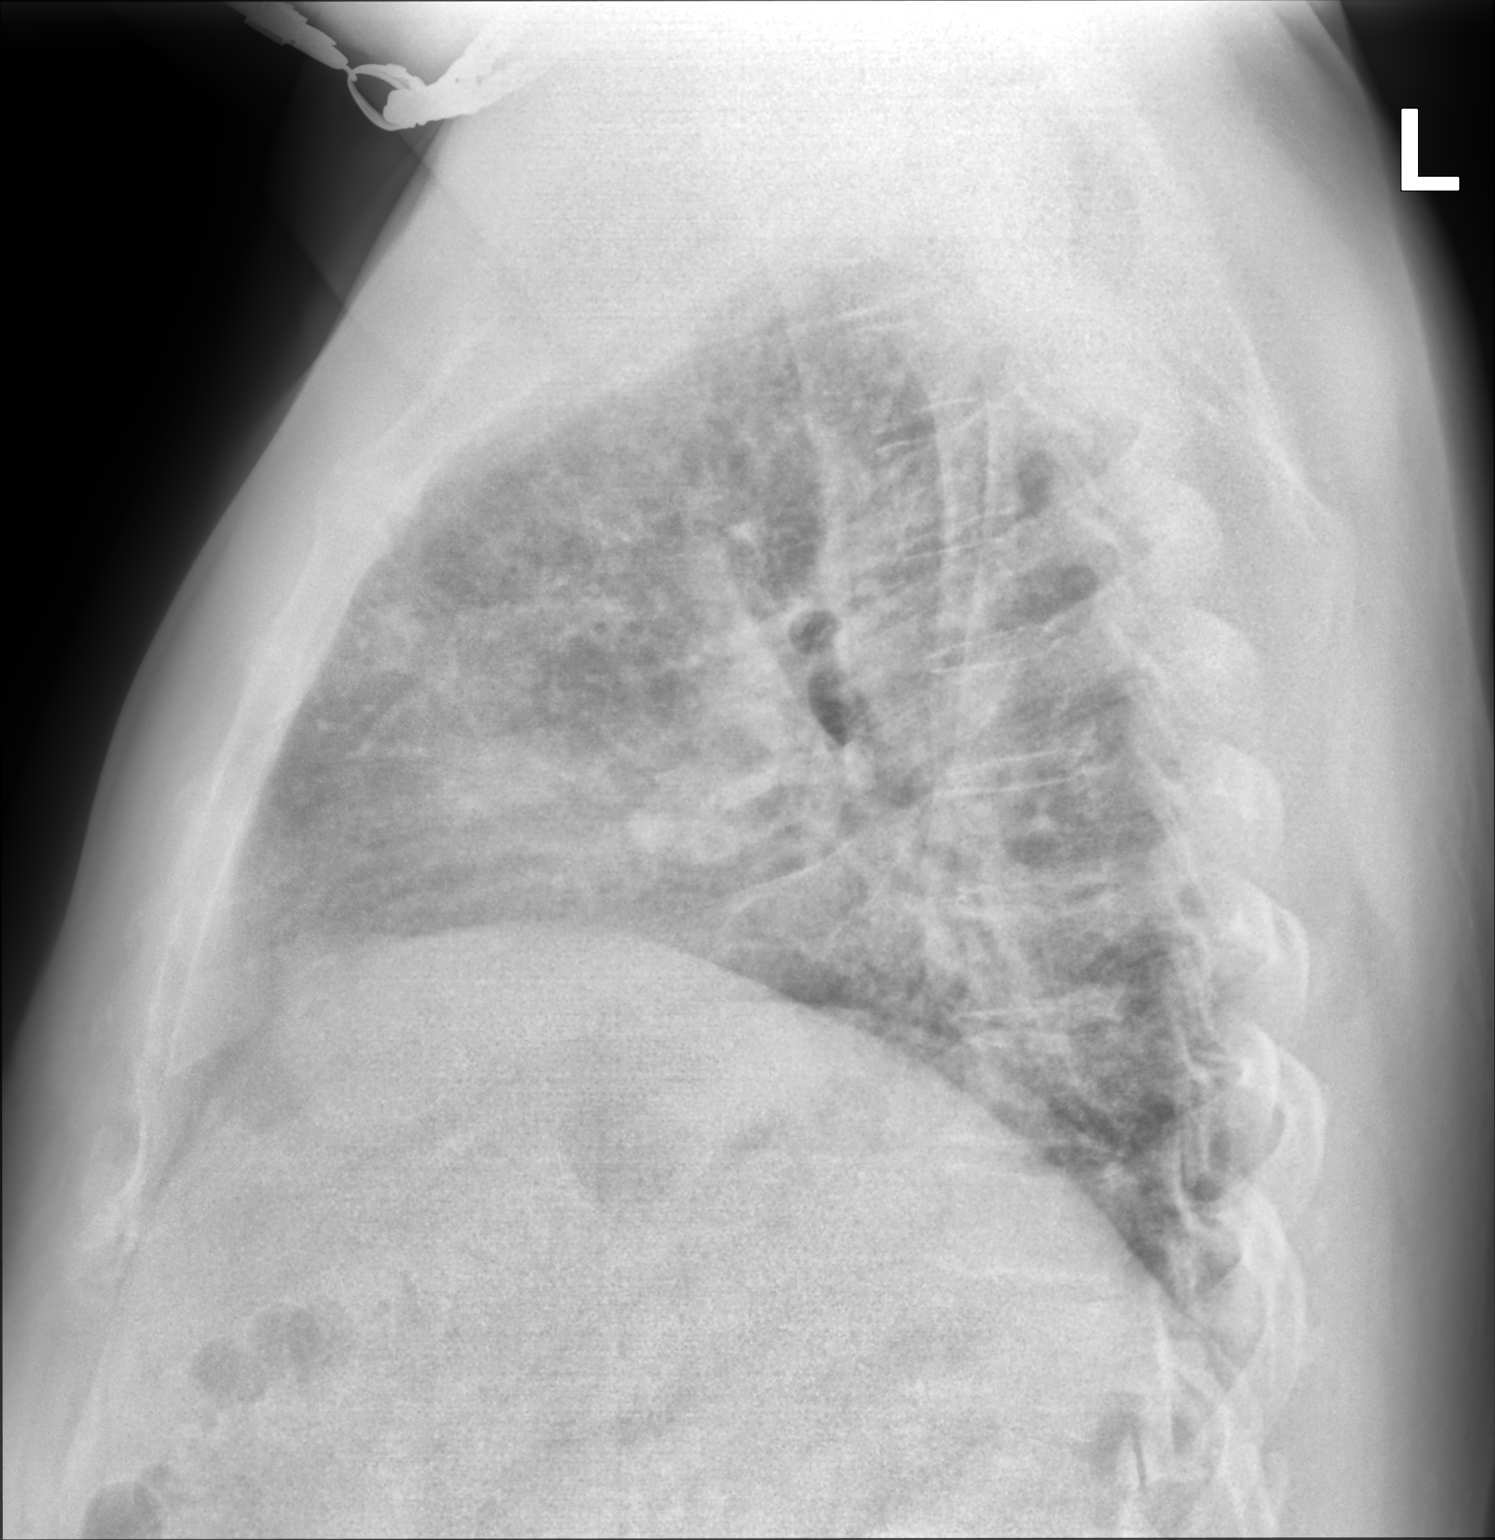

[2 of 2 positions shown; findings below may reference images not displayed]

FINDINGS: Lung volumes are low. There is diffuse interstitial coarsening.
Ill-defined opacity in the right mid lung and left lung base. Upper
normal heart size. Normal mediastinal contours. No pleural fluid or
pneumothorax. No acute osseous abnormalities are seen.
IMPRESSION: Low lung volumes with mild diffuse interstitial coarsening and
ill-defined right mid lung and left lung base opacities. Favor post
COVID fibrosis. No prior exams are available for comparison.

## 2022-07-10 IMAGING — CT CT CHEST HIGH RESOLUTION W/O CM
2 of 7 series · 14 of 36 positions shown, 17 images · non-contrast
Comparison: No priors.

CLINICAL DATA: 49-year-old male with increasing shortness of breath
with exercise. History of COVID infection in 7777. Former smoker.

EXAM:
CT CHEST WITHOUT CONTRAST
TECHNIQUE: Multidetector CT imaging of the chest was performed following the
standard protocol without intravenous contrast. High resolution
imaging of the lungs, as well as inspiratory and expiratory imaging,
was performed.

[Series 4: high resolution · axial · 0.85mm/px · z∈[+1228,+1466]mm · 11 of 287 slices shown, 14 images]
[im 24/287  mediastinal]
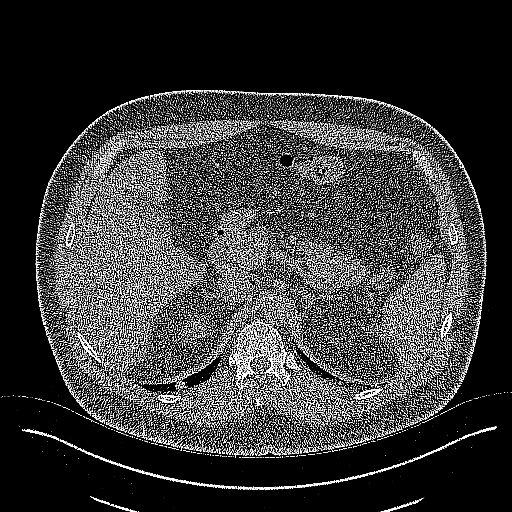
[im 24/287  lung]
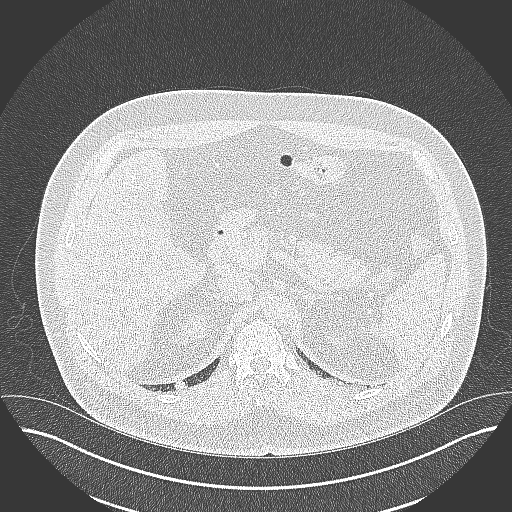
[im 48/287  lung]
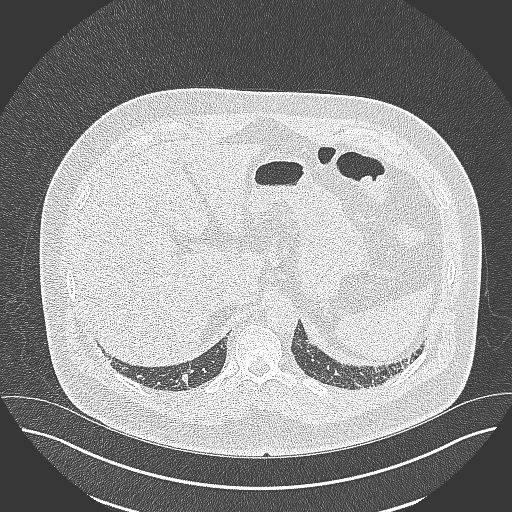
[im 72/287  lung]
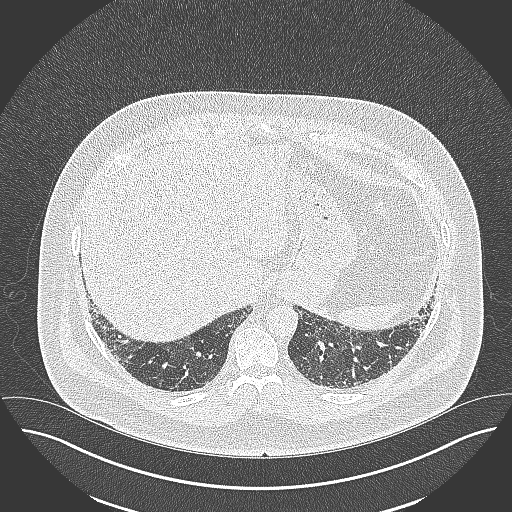
[im 96/287  lung]
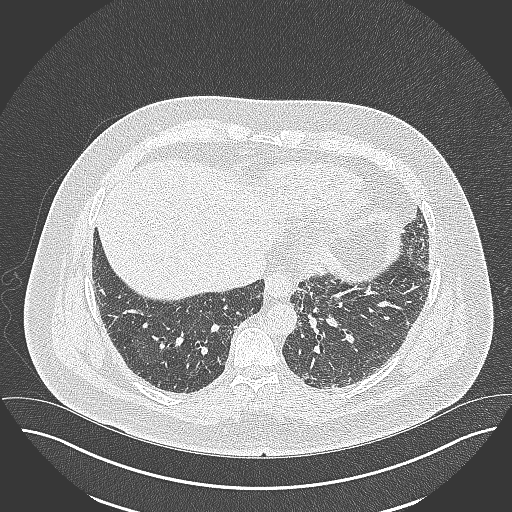
[im 120/287  mediastinal]
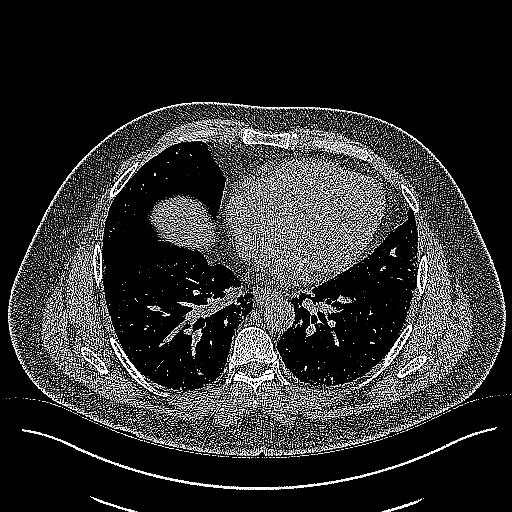
[im 120/287  lung]
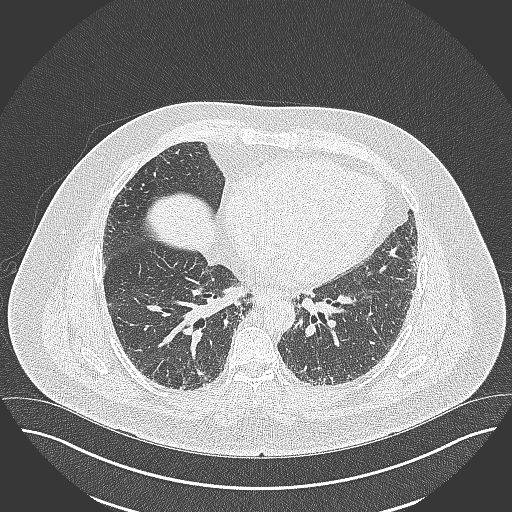
[im 144/287  lung]
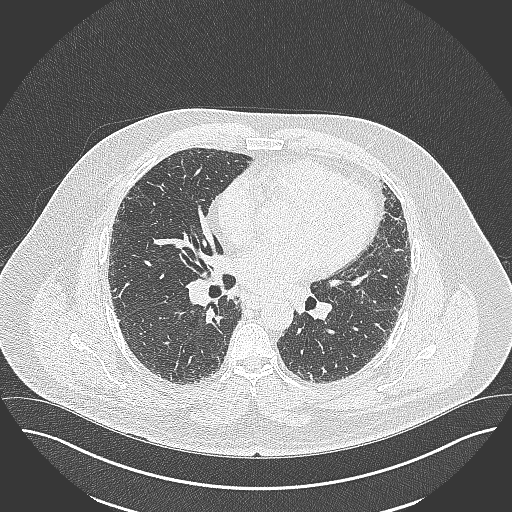
[im 167/287  lung]
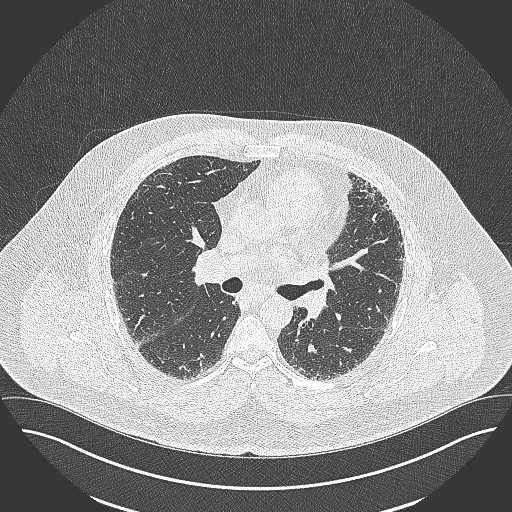
[im 191/287  lung]
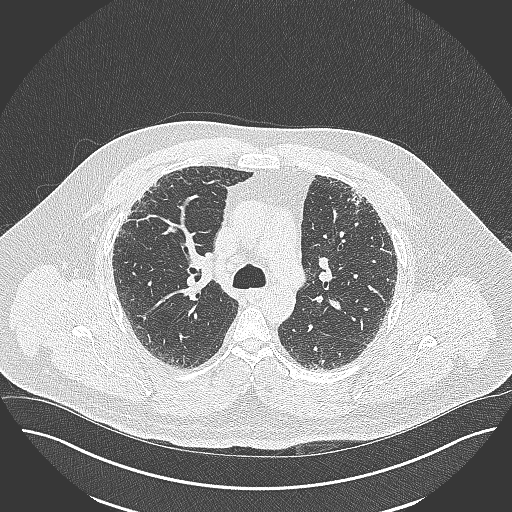
[im 215/287  mediastinal]
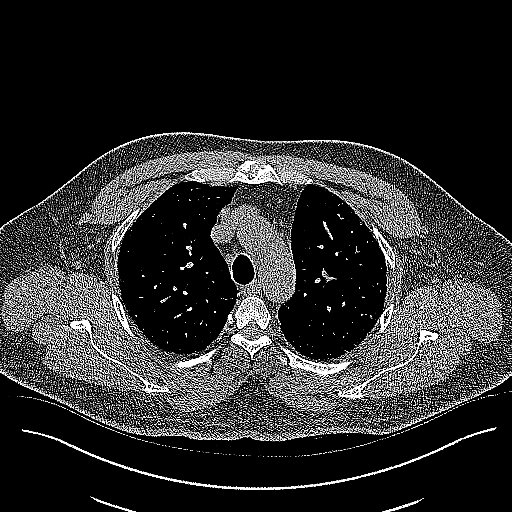
[im 215/287  lung]
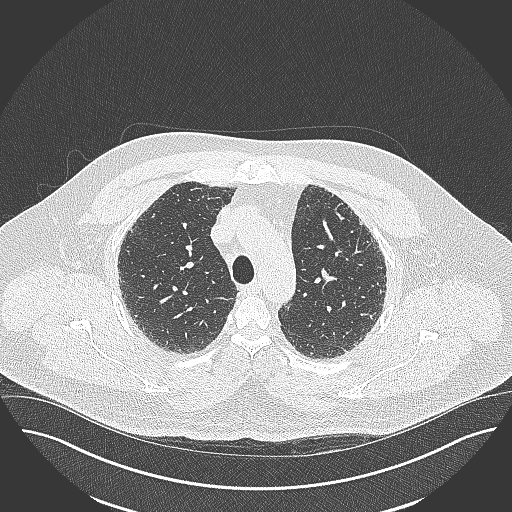
[im 239/287  lung]
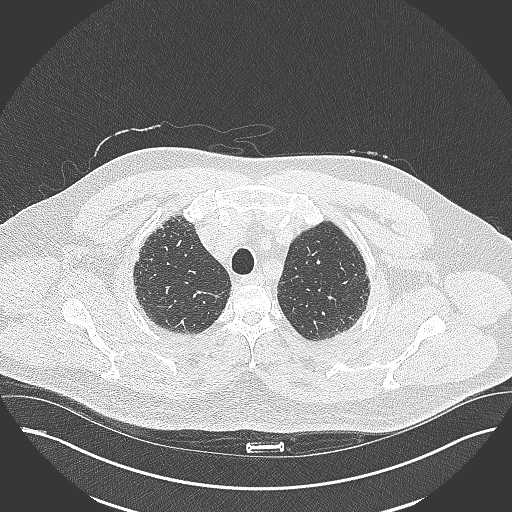
[im 263/287  lung]
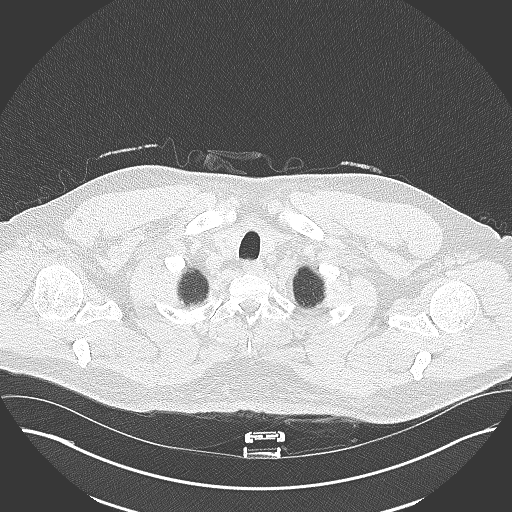

[Series 6: coronal · coronal · 0.59mm/px · 3 of 151 slices shown]
[im 31/151  lung]
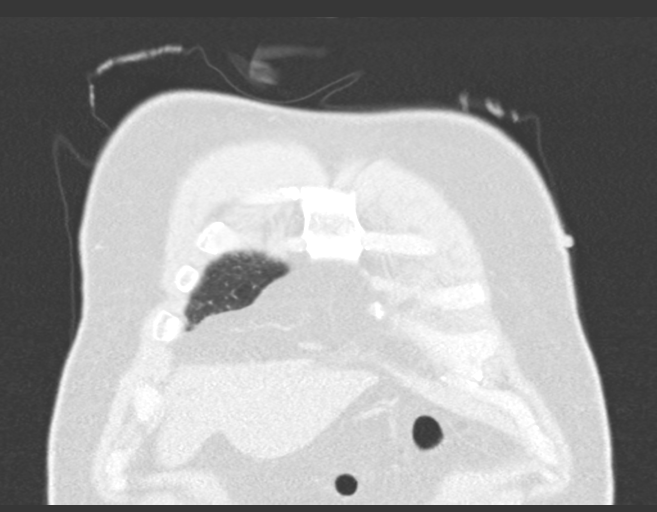
[im 61/151  lung]
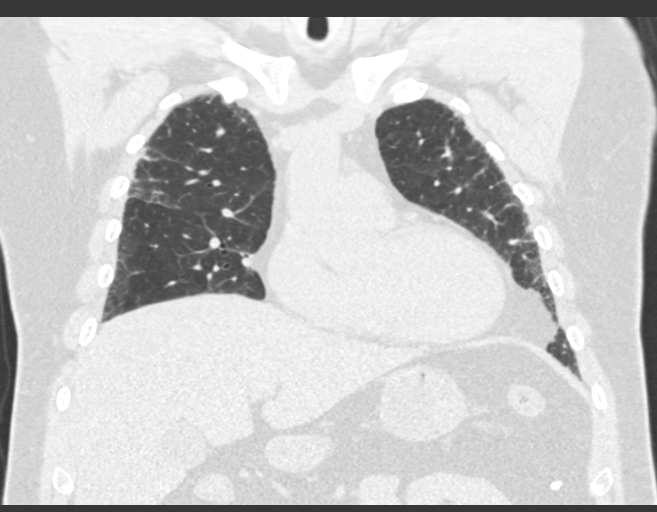
[im 91/151  lung]
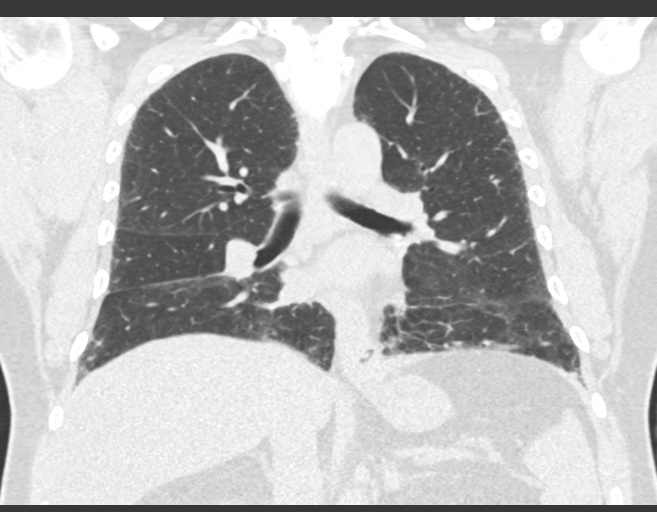

[14 of 36 positions shown; findings below may reference images not displayed]

FINDINGS: Cardiovascular: Heart size is normal. There is no significant
pericardial fluid, thickening or pericardial calcification. There is
aortic atherosclerosis, as well as atherosclerosis of the great
vessels of the mediastinum and the coronary arteries, including
calcified atherosclerotic plaque in the left main and left anterior
descending coronary arteries.

Mediastinum/Nodes: No pathologically enlarged mediastinal or hilar
lymph nodes. Please note that accurate exclusion of hilar adenopathy
is limited on noncontrast CT scans. Esophagus is unremarkable in
appearance. No axillary lymphadenopathy.

Lungs/Pleura: High-resolution images demonstrate a pattern of patchy
ground-glass attenuation and septal thickening scattered throughout
the lungs bilaterally with no discernible craniocaudal gradient. No
traction bronchiectasis, parenchymal banding or frank honeycombing.
Inspiratory and expiratory imaging demonstrates some mild to
moderate air trapping indicative of small airways disease. No acute
consolidative airspace disease. No pleural effusions. Multiple small
pulmonary nodules are noted throughout the lungs bilaterally,
measuring 8 mm or less in size.

Upper Abdomen: Mild diffuse low attenuation throughout the
visualized hepatic parenchyma, indicative of a background of hepatic
steatosis. Several subcentimeter low-attenuation lesions in the
liver, incompletely characterized on today's non-contrast CT
examination, but statistically likely to represent small cysts.

Musculoskeletal: There are no aggressive appearing lytic or blastic
lesions noted in the visualized portions of the skeleton.
IMPRESSION: 1. The appearance of the lungs is compatible with interstitial lung
disease, with a spectrum of findings considered indeterminate for
usual interstitial pneumonia (UIP) per current ATS guidelines. At
this time, findings are favored to reflect nonspecific interstitial
pneumonia (NSIP), however, continued attention on repeat
high-resolution chest CTs is recommended to assess for temporal
changes in the appearance of the lung parenchyma.
2. Multiple small pulmonary nodules scattered throughout the lungs
bilaterally measuring 8 mm or less in size. Initial follow-up
high-resolution chest CT is recommended in 3-6 months to re-evaluate
these nodules. This recommendation follows the consensus statement:
Guidelines for Management of Incidental Pulmonary Nodules Detected
[DATE].
3. Aortic atherosclerosis, in addition to left main and left
anterior descending coronary artery disease. Please note that
although the presence of coronary artery calcium documents the
presence of coronary artery disease, the severity of this disease
and any potential stenosis cannot be assessed on this non-gated CT
examination. Assessment for potential risk factor modification,
dietary therapy or pharmacologic therapy may be warranted, if
clinically indicated.
4. Hepatic steatosis.

Aortic Atherosclerosis ([id number]).

## 2022-09-17 IMAGING — CT CT ABD-PELV W/ CM
2 of 5 series · 15 of 46 positions shown, 17 images · IV contrast (APPLIED)
Comparison: 80 mL Omnipaque 350

CLINICAL DATA: Possible appendiceal mass on recent colonoscopy.

EXAM:
CT ABDOMEN AND PELVIS WITH CONTRAST
TECHNIQUE: Multidetector CT imaging of the abdomen and pelvis was performed
using the standard protocol following bolus administration of
intravenous contrast.
CONTRAST:  80mL OMNIPAQUE IOHEXOL 350 MG/ML SOLN

[Series 2: axial st · axial · 0.98mm/px · z∈[-705,-265]mm · 12 of 106 slices shown, 14 images]
[im 9/106  soft-tissue]
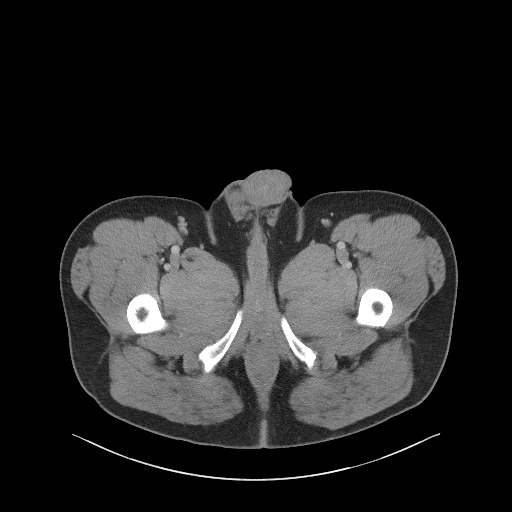
[im 9/106  bone]
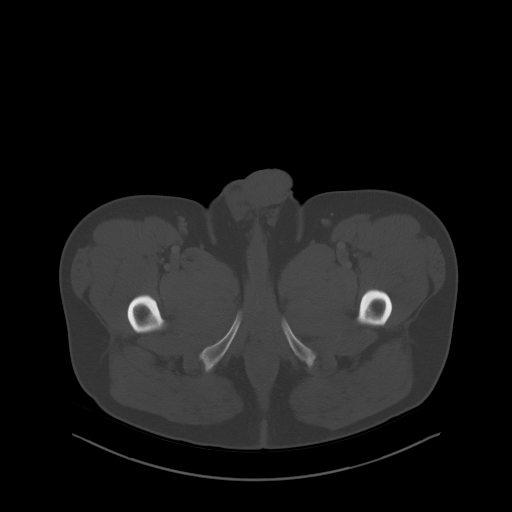
[im 17/106  soft-tissue]
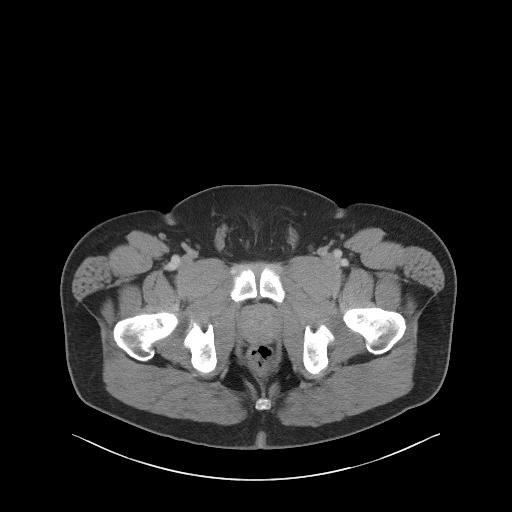
[im 25/106  soft-tissue]
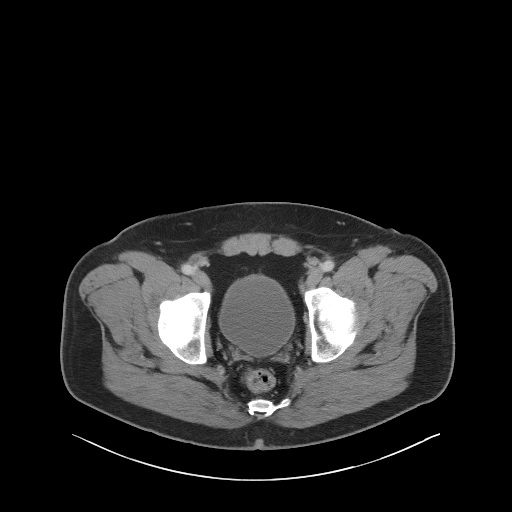
[im 33/106  soft-tissue]
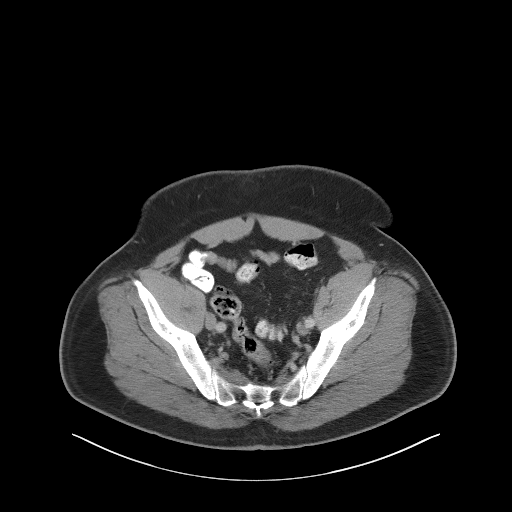
[im 41/106  soft-tissue]
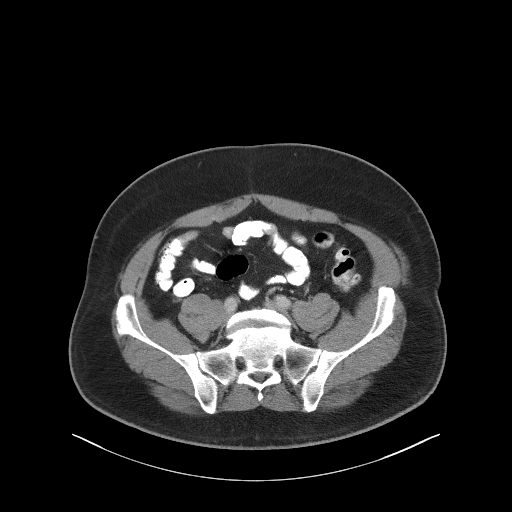
[im 49/106  soft-tissue]
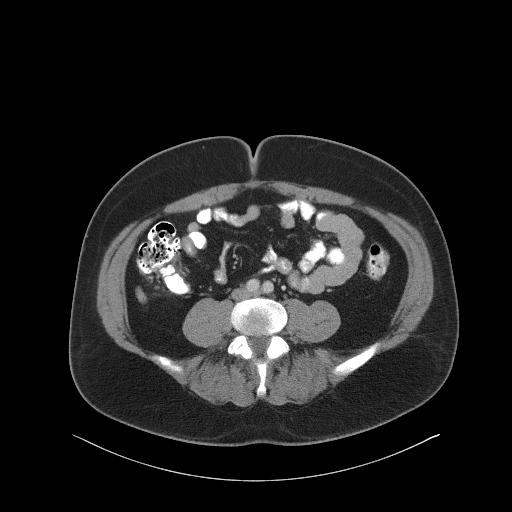
[im 57/106  soft-tissue]
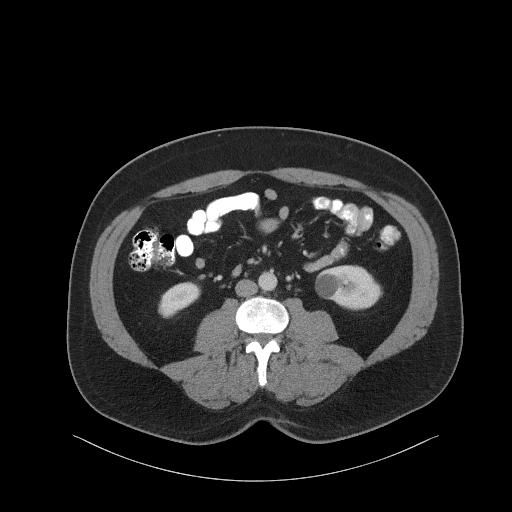
[im 65/106  soft-tissue]
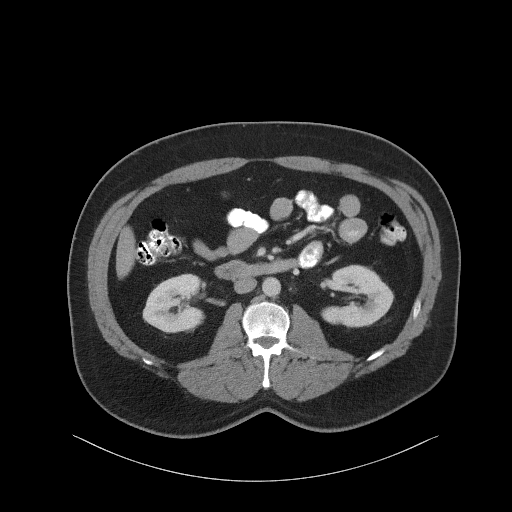
[im 73/106  soft-tissue]
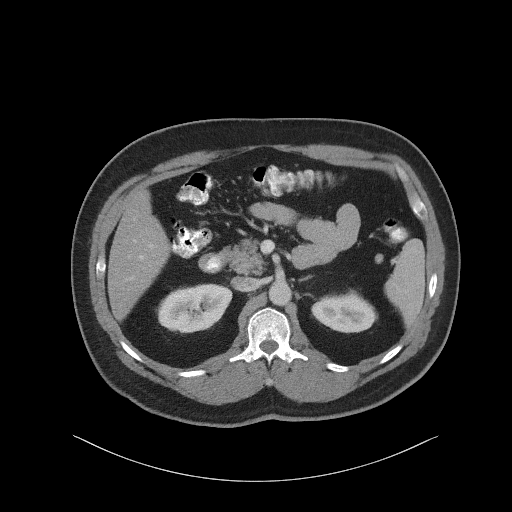
[im 73/106  bone]
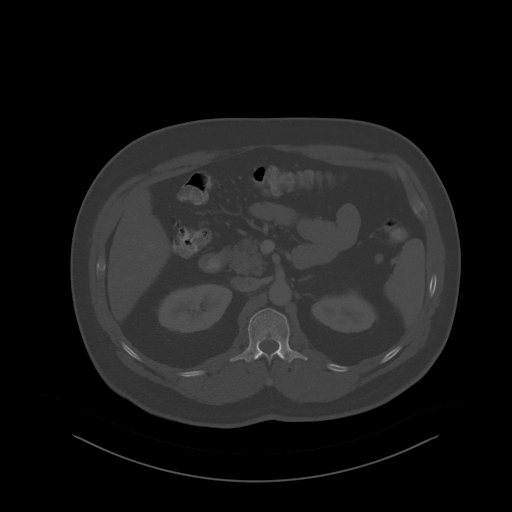
[im 81/106  soft-tissue]
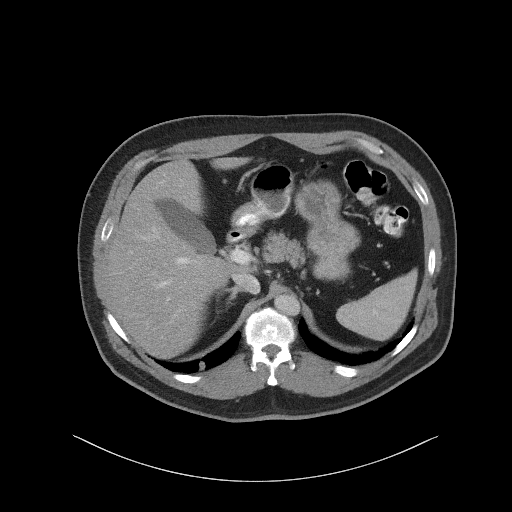
[im 89/106  soft-tissue]
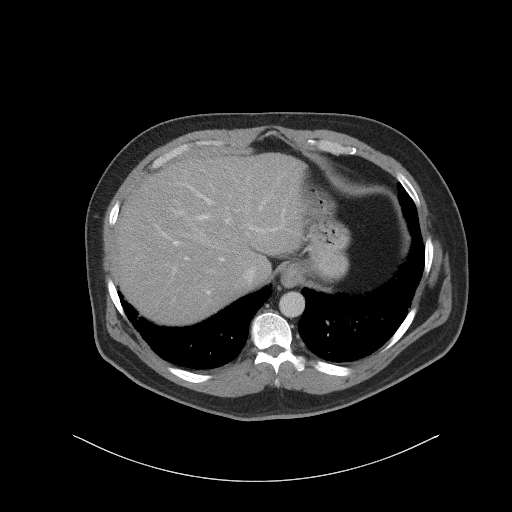
[im 97/106  soft-tissue]
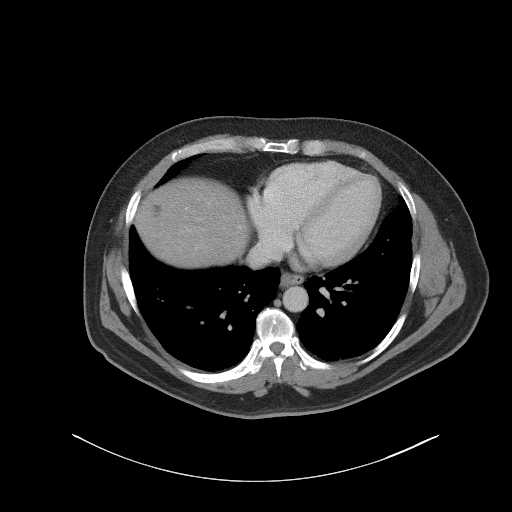

[Series 5: coronal st · coronal · 0.85mm/px · 3 of 123 slices shown]
[im 41/123  soft-tissue]
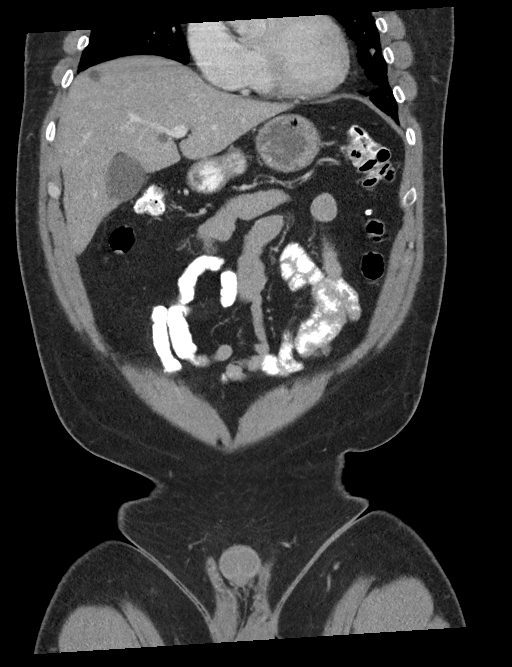
[im 55/123  soft-tissue]
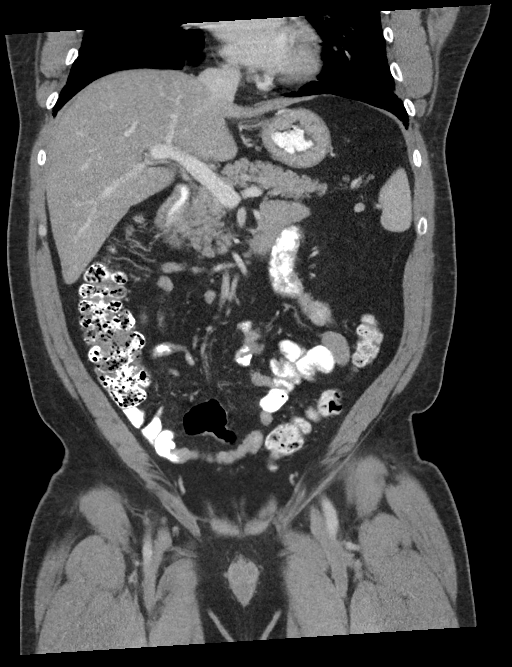
[im 68/123  soft-tissue]
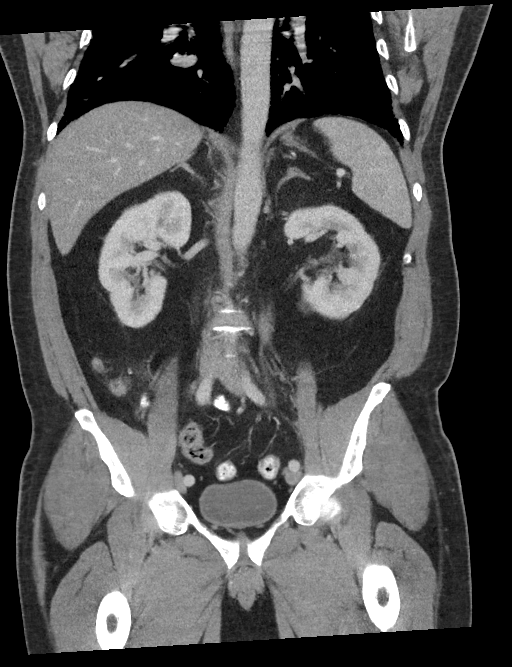

[15 of 46 positions shown; findings below may reference images not displayed]

FINDINGS: Lower Chest: 6 mm pulmonary nodules are seen in the lingula and
posterior left lower lobe. Noncalcified pulmonary nodule is also
seen in the right posterior costophrenic sulcus measures 8 mm.

Hepatobiliary: No hepatic masses identified. Several tiny sub-cm
hepatic cysts are seen in the dome of the right and left lobes. No
liver masses are identified. Gallbladder is unremarkable. No
evidence of biliary ductal dilatation.

Pancreas:  No mass or inflammatory changes.

Spleen: Within normal limits in size and appearance.

Adrenals/Urinary Tract: No masses identified. 2.4 cm benign left
renal cyst is noted. No evidence of ureteral calculi or
hydronephrosis. Unremarkable unopacified urinary bladder.

Stomach/Bowel: No evidence of obstruction, inflammatory process or
abnormal fluid collections. The appendix is normal in appearance and
no masses seen involving the appendix or cecum.

Vascular/Lymphatic: No pathologically enlarged lymph nodes. No acute
vascular findings.

Reproductive:  No mass or other significant abnormality.

Other:  None.

Musculoskeletal: No suspicious bone lesions identified. Bilateral L5
pars defects are seen, with moderate degenerative disc disease and
grade 2 anterolisthesis at L5-S1.
IMPRESSION: No evidence of appendiceal mass, appendicitis, or other acute
findings.

Bilateral L5 pars defects, with degenerative disc disease and grade
2 anterolisthesis at L5-S1.

Indeterminate bilateral lower lobe pulmonary nodules measuring up to
8 mm, which remain stable compared to prior study approximately 2
months ago. Non-contrast chest CT at 3 months is recommended. If the
nodules are stable at time of repeat CT, then future CT at 18-24
months (from today's scan) is considered optional for low-risk
patients, but is recommended for high-risk patients. This
recommendation follows the consensus statement: Guidelines for
Management of Incidental Pulmonary Nodules Detected on CT Images:

## 2023-04-03 ENCOUNTER — Encounter: Payer: Self-pay | Admitting: Family Medicine

## 2023-04-03 ENCOUNTER — Ambulatory Visit (INDEPENDENT_AMBULATORY_CARE_PROVIDER_SITE_OTHER): Payer: BC Managed Care – PPO | Admitting: Family Medicine

## 2023-04-03 VITALS — BP 120/72 | HR 83 | Temp 98.5°F | Ht 68.0 in | Wt 227.0 lb

## 2023-04-03 DIAGNOSIS — R7303 Prediabetes: Secondary | ICD-10-CM

## 2023-04-03 DIAGNOSIS — J8410 Pulmonary fibrosis, unspecified: Secondary | ICD-10-CM | POA: Diagnosis not present

## 2023-04-03 DIAGNOSIS — Z0000 Encounter for general adult medical examination without abnormal findings: Secondary | ICD-10-CM | POA: Diagnosis not present

## 2023-04-03 DIAGNOSIS — K760 Fatty (change of) liver, not elsewhere classified: Secondary | ICD-10-CM | POA: Insufficient documentation

## 2023-04-03 DIAGNOSIS — I700 Atherosclerosis of aorta: Secondary | ICD-10-CM | POA: Insufficient documentation

## 2023-04-03 DIAGNOSIS — H9191 Unspecified hearing loss, right ear: Secondary | ICD-10-CM

## 2023-04-03 DIAGNOSIS — E785 Hyperlipidemia, unspecified: Secondary | ICD-10-CM | POA: Diagnosis not present

## 2023-04-03 DIAGNOSIS — R911 Solitary pulmonary nodule: Secondary | ICD-10-CM | POA: Diagnosis not present

## 2023-04-03 DIAGNOSIS — I2510 Atherosclerotic heart disease of native coronary artery without angina pectoris: Secondary | ICD-10-CM | POA: Insufficient documentation

## 2023-04-03 NOTE — Assessment & Plan Note (Signed)
 Past due for follow-up scan to assess two nodules seen on previous exam.

## 2023-04-03 NOTE — Assessment & Plan Note (Signed)
 I will reassess lipids today.

## 2023-04-03 NOTE — Assessment & Plan Note (Signed)
 I will check a blood glucose and A1c today.

## 2023-04-03 NOTE — Assessment & Plan Note (Signed)
 Stable. Continues to have some basilar rales. His employer is planning PFTs.

## 2023-04-03 NOTE — Progress Notes (Signed)
 [facility] [address] [place] [address] Dept: [phone] Dept Fax: [phone]  Annual Physical Visit  Subjective:    Patient ID: Joel Robinson, male    DOB: 07-Apr-1970, 53 y.o..   MRN: [id number]  Chief Complaint  Patient presents with   Annual Exam    CPE/labs.     History of Present Illness:  Patient is in today for an annual physical/preventative visit.  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 He notes his shortness of breath has resolved. He is no longer noting dyspnea when climbing stairs. He tells me his work has plans to do a CXR, PFTs, and a TB test.   Review of Systems  Constitutional:  Negative for chills, diaphoresis, fever, malaise/fatigue and weight loss.  HENT:  Positive for hearing loss. Negative for congestion, ear pain, sinus pain, sore throat and tinnitus.        Notes decreased hearing in the right ear. He had a foreign body removed form the canal at one point, but has not had his hearing return to normal.  Eyes:  Negative for blurred vision, pain, discharge and redness.  Respiratory:  Negative for cough, shortness of breath and wheezing.   Cardiovascular:  Negative for chest pain and palpitations.  Gastrointestinal:  Positive for heartburn. Negative for abdominal pain, constipation, diarrhea, nausea and vomiting.       Occasional heartburn triggered by spicy foods. Manages with antacids.  Musculoskeletal:  Negative for back pain, joint pain and myalgias.       Has occasional left heel plantar fasciitis pain.  Skin:  Negative for itching and rash.  Psychiatric/Behavioral:  Negative for depression. The patient is not nervous/anxious.    Past Medical History: Patient Active Problem List   Diagnosis Date Noted   Degenerative disc disease at L5-S1 level 11/09/2020   Incidental pulmonary nodule, greater than or equal to 8mm  11/09/2020   History of colon polyps 11/05/2020   Prediabetes 05/14/2020   Borderline hyperlipidemia 05/14/2020   Pulmonary fibrosis, postinflammatory (HCC)- Post-COVID 05/14/2020   History reviewed. No pertinent surgical history. Family History  Problem Relation Age of Onset   Colon cancer Neg Hx    Colon polyps Neg Hx    Esophageal cancer Neg Hx    Rectal cancer Neg Hx    Stomach cancer Neg Hx    Outpatient Medications Prior to Visit  Medication Sig Dispense Refill   acetaminophen (TYLENOL) 325 MG tablet Take 650 mg by mouth every 6 (six) hours as needed.     albuterol  (VENTOLIN  HFA) 108 (90 Base) MCG/ACT inhaler Inhale 2 puffs into the lungs every 6 (six) hours as needed for wheezing or shortness of breath. 8 g 6   budesonide -formoterol  (SYMBICORT ) 160-4.5 MCG/ACT inhaler Inhale 2 puffs into the lungs 2 (two) times daily. 1 each 6   No facility-administered medications prior to visit.   No Known Allergies Objective:   Today's Vitals   04/03/23 1413  BP: 120/72  Pulse: 83  Temp: 98.5 F (36.9 C)  TempSrc: Temporal  SpO2: 96%  Weight: 227 lb (103 kg)  Height: 5' 8 (1.727 m)   Body mass index is 34.52 kg/m.   General: Well developed, well nourished. No acute distress. HEENT: Normocephalic, non-traumatic. PERRL, EOMI. Conjunctiva clear. External ears normal. EAC and TMs   normal bilaterally. Weber is equal and Rinne is A>B bilat. Nose clear without congestion or rhinorrhea. Mucous  membranes moist. Oropharynx clear. Good dentition. Neck: Supple. No lymphadenopathy. No thyromegaly. Lungs: Bibasilar rales. No wheezing or rhonchi. CV: RRR without murmurs or rubs. Pulses 2+ bilaterally. Abdomen: Soft, non-tender. Bowel sounds positive, normal pitch and frequency. No hepatosplenomegaly. No   rebound or guarding. Extremities: Full ROM. No joint swelling or tenderness. No edema noted. Skin: Warm and dry. No rashes. Psych: Alert and oriented. Normal mood and  affect.  Health Maintenance Due  Topic Date Due   Pneumococcal Vaccine 60-74 Years old (1 of 2 - PCV) Never done   DTaP/Tdap/Td (1 - Tdap) Never done   Zoster Vaccines- Shingrix (1 of 2) Never done   Imaging: CT of Abdomen and Pelvis w contrast (11/08/2020) IMPRESSION: No evidence of appendiceal mass, appendicitis, or other acute findings.   Bilateral L5 pars defects, with degenerative disc disease and grade 2 anterolisthesis at L5-S1.   Indeterminate bilateral lower lobe pulmonary nodules measuring up to 8 mm, which remain stable compared to prior study approximately 2 months ago. Non-contrast chest CT at 3 months is recommended. If the nodules are stable at time of repeat CT, then future CT at 18-24 months (from today's scan) is considered optional for low-risk patients, but is recommended for high-risk patients. This recommendation follows the consensus statement: Guidelines for Management of Incidental Pulmonary Nodules Detected on CT Images: From the Fleischner Society 2017; Radiology 2017; 284:228-243.    High Resolution CT of Chest (08/31/2020) IMPRESSION: 1. The appearance of the lungs is compatible with interstitial lung disease, with a spectrum of findings considered indeterminate for usual interstitial pneumonia (UIP) per current ATS guidelines. 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 2. Multiple small pulmonary nodules scattered throughout the lungs bilaterally measuring 8 mm or less in size. Initial follow-up high-resolution chest CT is recommended in 3-6 months to re-evaluate these nodules. This recommendation follows the consensus statement: Guidelines for Management of Incidental Pulmonary Nodules Detected on CT Images: From the Fleischner Society 2017; Radiology 2017;  284:228-243. 3. Aortic atherosclerosis, in addition to left main and left anterior descending coronary artery disease. Please note that although the presence of coronary artery calcium documents the presence of coronary artery disease, the severity of this disease and any potential stenosis cannot be assessed on this non-gated CT examination. Assessment for potential risk factor modification, dietary therapy or pharmacologic therapy may be warranted, if clinically indicated. 4. Hepatic steatosis.   Aortic Atherosclerosis (ICD10-I70.0).  Assessment & Plan:   Problem List Items Addressed This Visit       Respiratory   Pulmonary fibrosis, postinflammatory (HCC)- Post-COVID   Stable. Continues to have some basilar rales. His employer is planning PFTs.        Other   Borderline hyperlipidemia   I will reassess lipids today.      Relevant Orders   Lipid panel   Incidental pulmonary nodule, greater than or equal to 8mm   Past due for follow-up scan to assess two nodules seen on previous exam.      Relevant Orders   CT CHEST LCS NODULE F/U LOW DOSE WO CONTRAST   Prediabetes   I will check a blood glucose and A1c today.      Relevant Orders   Basic metabolic panel   Hemoglobin A1c   Other Visit Diagnoses       Annual physical exam    -  Primary   Overall fair health. Discussed recommended screenings and immunizations.     Hearing difficulty of right ear       I iwll refer to audiology for an assessment.   Relevant Orders   Ambulatory referral to Audiology       Return in about 1 year (around 04/02/2024) for Annual preventative care.   Garnette CHRISTELLA Simpler, MD

## 2023-04-04 LAB — BASIC METABOLIC PANEL
BUN/Creatinine Ratio: 13 (ref 9–20)
BUN: 13 mg/dL (ref 6–24)
CO2: 23 mmol/L (ref 20–29)
Calcium: 9.3 mg/dL (ref 8.7–10.2)
Chloride: 100 mmol/L (ref 96–106)
Creatinine, Ser: 1.01 mg/dL (ref 0.76–1.27)
Glucose: 129 mg/dL — ABNORMAL HIGH (ref 70–99)
Potassium: 3.7 mmol/L (ref 3.5–5.2)
Sodium: 139 mmol/L (ref 134–144)
eGFR: 89 mL/min/{1.73_m2} (ref >59)

## 2023-04-04 LAB — LIPID PANEL
Chol/HDL Ratio: 4.4 {ratio} (ref 0.0–5.0)
Cholesterol, Total: 177 mg/dL (ref 100–199)
HDL: 40 mg/dL (ref >39)
LDL Chol Calc (NIH): 117 mg/dL — ABNORMAL HIGH (ref 0–99)
Triglycerides: 110 mg/dL (ref 0–149)
VLDL Cholesterol Cal: 20 mg/dL (ref 5–40)

## 2023-04-04 LAB — HEMOGLOBIN A1C
Est. average glucose Bld gHb Est-mCnc: 128 mg/dL
Hgb A1c MFr Bld: 6.1 % — ABNORMAL HIGH (ref 4.8–5.6)

## 2023-04-06 ENCOUNTER — Telehealth: Payer: Self-pay

## 2023-04-06 ENCOUNTER — Encounter: Payer: Self-pay | Admitting: Family Medicine

## 2023-04-06 DIAGNOSIS — K769 Liver disease, unspecified: Secondary | ICD-10-CM

## 2023-04-06 DIAGNOSIS — R911 Solitary pulmonary nodule: Secondary | ICD-10-CM

## 2023-04-06 NOTE — Telephone Encounter (Signed)
 Copied from CRM [id number]. Topic: Clinical - Medical Advice >> Apr 06, 2023  3:35 PM Melissa C wrote: Reason for CRM: [facility] called and she stated that there were certain questions the patient was answering "yes" to on their screening such as smoking in the past few years and he stated he still may smoke one or two cigarettes a day and automatically they cannot schedule him based off of that information. If you have any questions, callback for Joel Robinson is [phone], extension [phone].  NEEDS TO BE A ct CHEST WITH OUT CONTRAST.    Please reorder this.  Thanks .Dm/cma

## 2023-04-06 NOTE — Addendum Note (Signed)
 Addended by: Graig Lawyer on: 04/06/2023 05:16 PM   Modules accepted: Orders

## 2023-04-06 NOTE — Telephone Encounter (Signed)
 Copied from CRM [id number]. Topic: Clinical - Medical Advice >> Apr 06, 2023  3:35 PM Melissa C wrote: Reason for CRM: [facility] called and she stated that there were certain questions the patient was answering "yes" to on their screening such as smoking in the past few years and he stated he still may smoke one or two cigarettes a day and automatically they cannot schedule him based off of that information. If you have any questions, callback for Alfonza Angry is [phone], extension [phone].

## 2023-04-07 NOTE — Telephone Encounter (Signed)
Order already changed. Dm/cma

## 2023-05-01 ENCOUNTER — Ambulatory Visit
Admission: RE | Admit: 2023-05-01 | Discharge: 2023-05-01 | Disposition: A | Discharge status: Home / Self Care | Payer: BC Managed Care – PPO | Source: Ambulatory Visit | Attending: Family Medicine | Admitting: Family Medicine

## 2023-05-01 DIAGNOSIS — R911 Solitary pulmonary nodule: Secondary | ICD-10-CM

## 2023-05-01 DIAGNOSIS — R918 Other nonspecific abnormal finding of lung field: Secondary | ICD-10-CM | POA: Diagnosis not present

## 2023-05-05 ENCOUNTER — Encounter: Payer: Self-pay | Admitting: Family Medicine

## 2023-05-05 NOTE — Addendum Note (Signed)
 Addended by: Loyola Mast on: 05/05/2023 08:09 AM   Modules accepted: Orders

## 2023-05-08 ENCOUNTER — Other Ambulatory Visit: Payer: BC Managed Care – PPO

## 2023-05-11 ENCOUNTER — Ambulatory Visit: Payer: BC Managed Care – PPO | Attending: Family Medicine | Admitting: Audiologist

## 2023-05-11 DIAGNOSIS — H9041 Sensorineural hearing loss, unilateral, right ear, with unrestricted hearing on the contralateral side: Secondary | ICD-10-CM | POA: Diagnosis not present

## 2023-05-11 DIAGNOSIS — H9311 Tinnitus, right ear: Secondary | ICD-10-CM | POA: Insufficient documentation

## 2023-05-11 NOTE — Procedures (Signed)
  Outpatient Audiology and [facility] [address] 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 He reports a small change in his right ear that has progressed over the last year. At first he went to the doctor in [place]. They removed a small leaf and wax from the right ear, but the hearing did not improve. He did not have a hearing test. Since then the hearing has gotten worse. He has a soft ringing in the right ear.  Jeronimo denies pain or pressure in either ear. He has an occasional popping feeling in his right ear only.  Takeo has history of hazardous noise exposure from working in [employer]. He wears earplugs deep in both ears.  Medical history shows no additional risk for hearing loss.    Evaluation:  Otoscopy showed a clear view of the tympanic membranes, bilaterally Tympanometry results were consistent with normal middle ear function, bilaterally   Audiometric testing was completed using Conventional Audiometry techniques with insert earphones and supraural headphones. Test results are consistent with unilateral sloping mild to profound right ear sensorineural hearing loss. Hearing normal in left ear. Speech Recognition Thresholds were obtained at 80dB HL in the right ear and at  20 dB HL in the left ear. Word Recognition Testing at 90dB in right and 60dB in left. Isadore scored 68% in the right ear and 100% in the left ear.    Results:  The test results were reviewed with Wynona Canes. He has a mild sloping to profound sensorineural  hearing loss in the right ear and normal hearing in left ear. This is not a hearing loss from age or noise. He needs to see Otolaryngology for medical evaluation and possible imaging. Since loss has been present for a year, it may not resolve but we still need him to see Otolaryngology to see what may have caused the loss. Macdonald reported understanding.  Audiogram printed and provided to [place].   Recommendations: Recommend referral to Otolaryngology due to severe asymmetric sensorineural hearing loss in right ear.    32 minutes spent testing and counseling on results.   If you have any questions please feel free to contact me at [phone].  Ammie Ferrier Stalnaker Au.D.  Audiologist   05/11/2023  8:32 AM  Cc: Loyola Mast, MD

## 2023-05-11 NOTE — Addendum Note (Signed)
 Addended by: Loyola Mast on: 05/11/2023 01:07 PM   Modules accepted: Orders

## 2023-05-15 ENCOUNTER — Encounter: Payer: Self-pay | Admitting: Family Medicine

## 2023-05-15 ENCOUNTER — Ambulatory Visit
Admission: RE | Admit: 2023-05-15 | Discharge: 2023-05-15 | Disposition: A | Discharge status: Home / Self Care | Source: Ambulatory Visit | Attending: Family Medicine | Admitting: Family Medicine

## 2023-05-15 DIAGNOSIS — K769 Liver disease, unspecified: Secondary | ICD-10-CM

## 2023-05-15 DIAGNOSIS — K7689 Other specified diseases of liver: Secondary | ICD-10-CM | POA: Insufficient documentation

## 2024-03-09 ENCOUNTER — Telehealth: Payer: Self-pay

## 2024-03-09 ENCOUNTER — Other Ambulatory Visit: Payer: Self-pay

## 2024-03-09 ENCOUNTER — Ambulatory Visit
Admission: RE | Admit: 2024-03-09 | Discharge: 2024-03-09 | Disposition: A | Discharge status: Home / Self Care | Attending: Physician Assistant | Admitting: Physician Assistant

## 2024-03-09 VITALS — BP 114/84 | HR 80 | Temp 98.4°F | Resp 18 | Ht 66.0 in | Wt 220.0 lb

## 2024-03-09 DIAGNOSIS — M940 Chondrocostal junction syndrome [Tietze]: Secondary | ICD-10-CM

## 2024-03-09 MED ORDER — PREDNISONE 20 MG PO TABS: ORAL_TABLET | ORAL | 0 refills | Status: AC

## 2024-03-09 NOTE — ED Provider Notes (Signed)
 [facility]    CSN: [id number] Arrival date & time: 03/09/24  1448      History   Chief Complaint Chief Complaint  Patient presents with   Medical Problem    HPI Joel Robinson is a 54 y.o. male.   HPI  Pt reports that he was sick about 2 weeks ago with URI symptoms and coughing. He states he started to have pain in the left side that was worse with coughing. His other symptoms seemed to resolve but he is still having some mild left side pain along the lower ribs. He also woke up with a bruise  on the left hip area 6 days ago and he does not recall an injury to the area.  He reports the bruise seems to be resolving and the affected area does not hurt like it does further up on the left side  He states now the pain in the left rib area will act up more if he sneezes or coughs    Past Medical History:  Diagnosis Date   Allergy     Patient Active Problem List   Diagnosis Date Noted   Benign liver cyst 05/15/2023   Aortic atherosclerosis 04/03/2023   Atherosclerosis of coronary artery 04/03/2023   Hepatic steatosis 04/03/2023   Degenerative disc disease at L5-S1 level 11/09/2020   Incidental pulmonary nodule, greater than or equal to 8mm 11/09/2020   History of colon polyps 11/05/2020   Prediabetes 05/14/2020   Borderline hyperlipidemia 05/14/2020   Pulmonary fibrosis, postinflammatory (HCC)- Post-COVID 05/14/2020    History reviewed. No pertinent surgical history.     Home Medications    Prior to Admission medications  Medication Sig Start Date End Date Taking? Authorizing Provider  predniSONE  (DELTASONE ) 20 MG tablet Take 60mg  PO daily x 2 days, then40mg  PO daily x 2 days, then 20mg  PO daily x 3 days 03/09/24  Yes Callaway Hailes E, PA-C  acetaminophen (TYLENOL) 325 MG tablet Take 650 mg by mouth every 6 (six) hours as needed.    [provider]  albuterol  (VENTOLIN  HFA) 108 (90 Base) MCG/ACT inhaler Inhale 2 puffs into the lungs every 6 (six)  hours as needed for wheezing or shortness of breath. 12/04/20   Kara Dorn NOVAK, MD  budesonide -formoterol  (SYMBICORT ) 160-4.5 MCG/ACT inhaler Inhale 2 puffs into the lungs 2 (two) times daily. 11/02/20   Hope Almarie ORN, NP    Family History Family History  Problem Relation Age of Onset   Colon cancer Neg Hx    Colon polyps Neg Hx    Esophageal cancer Neg Hx    Rectal cancer Neg Hx    Stomach cancer Neg Hx     Social History Social History[1]   Allergies   Patient has no known allergies.   Review of Systems Review of Systems  Constitutional:  Negative for chills and fever.  Respiratory:  Negative for cough, shortness of breath and wheezing.   Skin:        Bruising to left side/hip      Physical Exam Triage Vital Signs ED Triage Vitals  Encounter Vitals Group     BP 03/09/24 1529 114/84     Girls Systolic BP Percentile --      Girls Diastolic BP Percentile --      Boys Systolic BP Percentile --      Boys Diastolic BP Percentile --      Pulse Rate 03/09/24 1529 80     Resp 03/09/24 1529  18     Temp 03/09/24 1529 98.4 F (36.9 C)     Temp Source 03/09/24 1529 Oral     SpO2 03/09/24 1529 95 %     Weight 03/09/24 1529 220 lb (99.8 kg)     Height 03/09/24 1529 5' 6 (1.676 m)     Head Circumference --      Peak Flow --      Pain Score 03/09/24 1545 3     Pain Loc --      Pain Education --      Exclude from Growth Chart --    No data found.  Updated Vital Signs BP 114/84 (BP Location: Right Arm)   Pulse 80   Temp 98.4 F (36.9 C) (Oral)   Resp 18   Ht 5' 6 (1.676 m)   Wt 220 lb (99.8 kg)   SpO2 95%   BMI 35.51 kg/m   Visual Acuity Right Eye Distance:   Left Eye Distance:   Bilateral Distance:    Right Eye Near:   Left Eye Near:    Bilateral Near:     Physical Exam Vitals reviewed.  Constitutional:      General: He is awake.     Appearance: Normal appearance. He is well-developed and well-groomed.  HENT:     Head: Normocephalic and  atraumatic.  Eyes:     Extraocular Movements: Extraocular movements intact.     Conjunctiva/sclera: Conjunctivae normal.  Pulmonary:     Effort: Pulmonary effort is normal.     Breath sounds: Normal breath sounds. No decreased air movement. No decreased breath sounds, wheezing, rhonchi or rales.  Musculoskeletal:     Cervical back: Normal range of motion.       Back:  Neurological:     Mental Status: He is alert and oriented to person, place, and time.  Psychiatric:        Attention and Perception: Attention normal.        Mood and Affect: Mood normal.        Speech: Speech normal.        Behavior: Behavior normal. Behavior is cooperative.      UC Treatments / Results  Labs (all labs ordered are listed, but only abnormal results are displayed) Labs Reviewed - No data to display  EKG   Radiology No results found.  Procedures Procedures (including critical care time)  Medications Ordered in UC Medications - No data to display  Initial Impression / Assessment and Plan / UC Course  I have reviewed the triage vital signs and the nursing notes.  Pertinent labs & imaging results that were available during my care of the patient were reviewed by me and considered in my medical decision making (see chart for details).      Final Clinical Impressions(s) / UC Diagnoses   Final diagnoses:  Costochondritis   Patient is here today with concerns of left side rib pain has been ongoing for about 2-1/2 weeks but gradually improving.  Patient states initially he was sick with URI type symptoms but these have largely resolved.  He states at the hide of his symptoms especially when he was having coughing spells he had more severe right sided pain but even this has improved.  Patient is concerned that it has persisted this long.  Physical exam does reveal some mild tenderness along the left lower rib area.  No evidence of swelling, deformity, redness or erythema.  Patient does have a  rather large bruise  on the left hip area but he denies pain at this time.  Suspect patient may have a mild case of costochondritis secondary to his recent illness.  Will start a steroid taper to assist with symptom management.  Reviewed appropriate use of x-ray and patient states that he is comfortable with waiting and seeing if his symptoms resolve even further before getting an x-ray.  Follow-up as needed for persistent or progressing symptoms  Discharge Instructions   None    ED Prescriptions     Medication Sig Dispense Auth. Provider   predniSONE  (DELTASONE ) 20 MG tablet Take 60mg  PO daily x 2 days, then40mg  PO daily x 2 days, then 20mg  PO daily x 3 days 13 tablet Taras Rask E, PA-C      PDMP not reviewed this encounter.     [1]  Social History Tobacco Use   Smoking status: Former    Types: Cigarettes   Smokeless tobacco: Never  Vaping Use   Vaping status: Never Used  Substance Use Topics   Alcohol use: Not Currently   Drug use: Never     Joel Rocky BRAVO, PA-C 03/09/24 1733  "

## 2024-03-09 NOTE — Discharge Instructions (Addendum)
" °  You were seen today for concerns of left side pain.  At this time I suspect this is due to inflammation in the tissue between your ribs likely due to your recent coughing.  Help with your symptoms have sent in a steroid taper and recommend that you finish this to assist with inflammation. I have sent in a script for Prednisone  taper to be taken in the morning with breakfast per the instructions on the container Remember that steroids can cause sleeplessness, irritability, increased hunger and elevated glucose levels so be mindful of these side effects. They should lessen as you progress to the lower doses of the taper.  "

## 2024-03-09 NOTE — ED Triage Notes (Signed)
 Pt presents with concerns of left lower abdominal bruising + posterior lower left-sided back pain. Noticed the bruising about six days ago. Does not recall bumping into an object/recent injury. Pain in posterior lower left side present for approximately 2.5 weeks. Pain increases with coughing and sneezing. Currently rates overall pain a 3/10 in triage. Has taken Ibuprofen at home with no improvement. Does note that before the pain began, he had a productive cough with SOB. States these sxs have now resolved.

## 2024-03-09 NOTE — Telephone Encounter (Signed)
 This RN called patient at this time. Name and DOB verified. Called as provider, Erin Mecum PA, is concerned about patient appointment comment. States he is experiencing pain in left rib area and bruising in the left lower abdomen. Does not recall any injuries or bumping into an object. Bruising has begun to improve over the last few days.  Pt states the earliest he could arrive is 3 PM. PA made aware.
